# Patient Record
Sex: Female | Born: 1965 | Race: White | Hispanic: No | Marital: Single | Smoking: Current every day smoker
Health system: Southern US, Community
[De-identification: ages and names within clinical notes are randomized; demographics above are authoritative.]

## PROBLEM LIST (undated history)

## (undated) DIAGNOSIS — M5030 Other cervical disc degeneration, unspecified cervical region: Secondary | ICD-10-CM

## (undated) DIAGNOSIS — M5136 Other intervertebral disc degeneration, lumbar region: Secondary | ICD-10-CM

## (undated) DIAGNOSIS — D751 Secondary polycythemia: Secondary | ICD-10-CM

## (undated) DIAGNOSIS — I10 Essential (primary) hypertension: Secondary | ICD-10-CM

## (undated) DIAGNOSIS — M069 Rheumatoid arthritis, unspecified: Secondary | ICD-10-CM

## (undated) DIAGNOSIS — M8700 Idiopathic aseptic necrosis of unspecified bone: Secondary | ICD-10-CM

## (undated) DIAGNOSIS — F32A Depression, unspecified: Secondary | ICD-10-CM

## (undated) DIAGNOSIS — M4180 Other forms of scoliosis, site unspecified: Secondary | ICD-10-CM

## (undated) DIAGNOSIS — E119 Type 2 diabetes mellitus without complications: Secondary | ICD-10-CM

## (undated) DIAGNOSIS — F329 Major depressive disorder, single episode, unspecified: Secondary | ICD-10-CM

## (undated) DIAGNOSIS — E669 Obesity, unspecified: Secondary | ICD-10-CM

## (undated) DIAGNOSIS — M5134 Other intervertebral disc degeneration, thoracic region: Secondary | ICD-10-CM

## (undated) DIAGNOSIS — Z720 Tobacco use: Secondary | ICD-10-CM

## (undated) DIAGNOSIS — G629 Polyneuropathy, unspecified: Secondary | ICD-10-CM

## (undated) DIAGNOSIS — D6859 Other primary thrombophilia: Secondary | ICD-10-CM

## (undated) DIAGNOSIS — F419 Anxiety disorder, unspecified: Secondary | ICD-10-CM

## (undated) DIAGNOSIS — M479 Spondylosis, unspecified: Secondary | ICD-10-CM

## (undated) HISTORY — DX: Other forms of scoliosis, site unspecified: M41.80

## (undated) HISTORY — DX: Other intervertebral disc degeneration, thoracic region: M51.34

## (undated) HISTORY — DX: Major depressive disorder, single episode, unspecified: F32.9

## (undated) HISTORY — DX: Secondary polycythemia: D75.1

## (undated) HISTORY — DX: Rheumatoid arthritis, unspecified: M06.9

## (undated) HISTORY — PX: EYE SURGERY: SHX253

## (undated) HISTORY — DX: Idiopathic aseptic necrosis of unspecified bone: M87.00

## (undated) HISTORY — DX: Type 2 diabetes mellitus without complications: E11.9

## (undated) HISTORY — DX: Obesity, unspecified: E66.9

## (undated) HISTORY — DX: Depression, unspecified: F32.A

## (undated) HISTORY — DX: Spondylosis, unspecified: M47.9

## (undated) HISTORY — DX: Other primary thrombophilia: D68.59

## (undated) HISTORY — DX: Other intervertebral disc degeneration, lumbar region: M51.36

## (undated) HISTORY — DX: Other cervical disc degeneration, unspecified cervical region: M50.30

## (undated) HISTORY — DX: Anxiety disorder, unspecified: F41.9

## (undated) HISTORY — DX: Essential (primary) hypertension: I10

## (undated) HISTORY — DX: Polyneuropathy, unspecified: G62.9

## (undated) HISTORY — DX: Tobacco use: Z72.0

## (undated) HISTORY — PX: BILATERAL CARPAL TUNNEL RELEASE: SHX6508

## (undated) HISTORY — PX: THIRD VENTRICULOSTOMY: SHX2497

---

## 2014-03-06 ENCOUNTER — Other Ambulatory Visit (HOSPITAL_COMMUNITY): Payer: Self-pay

## 2014-03-06 ENCOUNTER — Other Ambulatory Visit (HOSPITAL_COMMUNITY): Payer: Self-pay | Admitting: Nurse Practitioner

## 2014-03-06 ENCOUNTER — Ambulatory Visit (HOSPITAL_COMMUNITY)
Admission: RE | Admit: 2014-03-06 | Discharge: 2014-03-06 | Disposition: A | Discharge status: Home / Self Care | Payer: Medicare Other | Source: Ambulatory Visit | Attending: Nurse Practitioner | Admitting: Nurse Practitioner

## 2014-03-06 DIAGNOSIS — M47812 Spondylosis without myelopathy or radiculopathy, cervical region: Secondary | ICD-10-CM | POA: Diagnosis not present

## 2014-03-06 DIAGNOSIS — S32009A Unspecified fracture of unspecified lumbar vertebra, initial encounter for closed fracture: Secondary | ICD-10-CM | POA: Diagnosis not present

## 2014-03-06 DIAGNOSIS — M549 Dorsalgia, unspecified: Secondary | ICD-10-CM | POA: Diagnosis present

## 2014-03-06 DIAGNOSIS — M47817 Spondylosis without myelopathy or radiculopathy, lumbosacral region: Secondary | ICD-10-CM | POA: Diagnosis not present

## 2014-03-06 DIAGNOSIS — M5137 Other intervertebral disc degeneration, lumbosacral region: Secondary | ICD-10-CM | POA: Diagnosis not present

## 2014-03-06 DIAGNOSIS — M542 Cervicalgia: Secondary | ICD-10-CM | POA: Diagnosis not present

## 2014-03-06 DIAGNOSIS — F17200 Nicotine dependence, unspecified, uncomplicated: Secondary | ICD-10-CM | POA: Insufficient documentation

## 2014-03-06 DIAGNOSIS — IMO0002 Reserved for concepts with insufficient information to code with codable children: Secondary | ICD-10-CM

## 2014-03-06 DIAGNOSIS — R918 Other nonspecific abnormal finding of lung field: Secondary | ICD-10-CM | POA: Insufficient documentation

## 2014-03-06 DIAGNOSIS — M5489 Other dorsalgia: Secondary | ICD-10-CM

## 2014-03-06 DIAGNOSIS — M51379 Other intervertebral disc degeneration, lumbosacral region without mention of lumbar back pain or lower extremity pain: Secondary | ICD-10-CM | POA: Insufficient documentation

## 2014-03-14 ENCOUNTER — Other Ambulatory Visit (HOSPITAL_COMMUNITY): Payer: Self-pay

## 2014-03-14 DIAGNOSIS — J984 Other disorders of lung: Secondary | ICD-10-CM

## 2014-03-21 ENCOUNTER — Ambulatory Visit (HOSPITAL_COMMUNITY): Payer: Medicare Other | Attending: Nurse Practitioner

## 2014-09-16 ENCOUNTER — Ambulatory Visit (HOSPITAL_COMMUNITY): Payer: Medicare Other | Admitting: Hematology & Oncology

## 2014-10-03 ENCOUNTER — Encounter (HOSPITAL_COMMUNITY): Payer: Medicare Other | Attending: Hematology & Oncology | Admitting: Hematology & Oncology

## 2014-10-03 ENCOUNTER — Encounter (HOSPITAL_COMMUNITY): Payer: Self-pay | Admitting: Hematology & Oncology

## 2014-10-03 VITALS — BP 147/65 | HR 78 | Temp 98.2°F | Resp 20 | Ht 64.0 in | Wt 178.9 lb

## 2014-10-03 DIAGNOSIS — D6859 Other primary thrombophilia: Secondary | ICD-10-CM

## 2014-10-03 DIAGNOSIS — D751 Secondary polycythemia: Secondary | ICD-10-CM

## 2014-10-03 DIAGNOSIS — Z86711 Personal history of pulmonary embolism: Secondary | ICD-10-CM | POA: Diagnosis not present

## 2014-10-03 DIAGNOSIS — Z720 Tobacco use: Secondary | ICD-10-CM

## 2014-10-03 DIAGNOSIS — Z86718 Personal history of other venous thrombosis and embolism: Secondary | ICD-10-CM

## 2014-10-03 DIAGNOSIS — M069 Rheumatoid arthritis, unspecified: Secondary | ICD-10-CM | POA: Diagnosis not present

## 2014-10-03 DIAGNOSIS — Z7901 Long term (current) use of anticoagulants: Secondary | ICD-10-CM | POA: Diagnosis not present

## 2014-10-03 DIAGNOSIS — Z139 Encounter for screening, unspecified: Secondary | ICD-10-CM | POA: Insufficient documentation

## 2014-10-03 LAB — COMPREHENSIVE METABOLIC PANEL
ALBUMIN: 4 g/dL (ref 3.5–5.2)
ALT: 12 U/L (ref 0–35)
ANION GAP: 9 (ref 5–15)
AST: 15 U/L (ref 0–37)
Alkaline Phosphatase: 101 U/L (ref 39–117)
BUN: 10 mg/dL (ref 6–23)
CO2: 25 mmol/L (ref 19–32)
Calcium: 9.3 mg/dL (ref 8.4–10.5)
Chloride: 103 mmol/L (ref 96–112)
Creatinine, Ser: 0.49 mg/dL — ABNORMAL LOW (ref 0.50–1.10)
GFR calc non Af Amer: >90 mL/min (ref >90)
Glucose, Bld: 108 mg/dL — ABNORMAL HIGH (ref 70–99)
POTASSIUM: 3.5 mmol/L (ref 3.5–5.1)
Sodium: 137 mmol/L (ref 135–145)
TOTAL PROTEIN: 7.7 g/dL (ref 6.0–8.3)
Total Bilirubin: 0.4 mg/dL (ref 0.3–1.2)

## 2014-10-03 LAB — CBC WITH DIFFERENTIAL/PLATELET
Basophils Absolute: 0 10*3/uL (ref 0.0–0.1)
Basophils Relative: 0 % (ref 0–1)
Eosinophils Absolute: 0.3 10*3/uL (ref 0.0–0.7)
Eosinophils Relative: 2 % (ref 0–5)
HEMATOCRIT: 46.3 % — AB (ref 36.0–46.0)
HEMOGLOBIN: 16.3 g/dL — AB (ref 12.0–15.0)
Lymphocytes Relative: 35 % (ref 12–46)
Lymphs Abs: 4.4 10*3/uL — ABNORMAL HIGH (ref 0.7–4.0)
MCH: 34.9 pg — AB (ref 26.0–34.0)
MCHC: 35.2 g/dL (ref 30.0–36.0)
MCV: 99.1 fL (ref 78.0–100.0)
Monocytes Absolute: 0.6 10*3/uL (ref 0.1–1.0)
Monocytes Relative: 5 % (ref 3–12)
Neutro Abs: 7.3 10*3/uL (ref 1.7–7.7)
Neutrophils Relative %: 58 % (ref 43–77)
Platelets: 220 10*3/uL (ref 150–400)
RBC: 4.67 MIL/uL (ref 3.87–5.11)
RDW: 14.5 % (ref 11.5–15.5)
WBC: 12.6 10*3/uL — ABNORMAL HIGH (ref 4.0–10.5)

## 2014-10-03 MED ORDER — ENOXAPARIN SODIUM 80 MG/0.8ML ~~LOC~~ SOLN: 80.0000 mg | Freq: Two times a day (BID) | SUBCUTANEOUS | Status: DC

## 2014-10-03 NOTE — Progress Notes (Signed)
Thurnell Lose presented for [employer]. Labs per MD order drawn via Peripheral Line 23 gauge needle inserted in left AC  Good blood return present. Procedure without incident.  Needle removed intact. Patient tolerated procedure well.

## 2014-10-03 NOTE — Patient Instructions (Signed)
[facility] at [facility] Discharge Instructions  RECOMMENDATIONS MADE BY THE CONSULTANT AND ANY TEST RESULTS WILL BE SENT TO YOUR REFERRING PHYSICIAN.  Exam and discussion by Dr. Galen Manila Will check labs today Get a release from you to get records from your previous MD Will get mammogram and CT of chest scheduled as well. Prescription for your lovenox was escribed to your pharmacy.  Call us with any questions or concerns.  Follow-up in 6 months with labs and office visit.   Thank you for choosing [facility] at [facility] to provide your oncology and hematology care.  To afford each patient quality time with our provider, please arrive at least 15 minutes before your scheduled appointment time.    You need to re-schedule your appointment should you arrive 10 or more minutes late.  We strive to give you quality time with our providers, and arriving late affects you and other patients whose appointments are after yours.  Also, if you no show three or more times for appointments you may be dismissed from the clinic at the providers discretion.     Again, thank you for choosing [facility].  Our hope is that these requests will decrease the amount of time that you wait before being seen by our physicians.       _____________________________________________________________  Should you have questions after your visit to [facility], please contact our office at [phone] between the hours of 8:30 a.m. and 4:30 p.m.  Voicemails left after 4:30 p.m. will not be returned until the following business day.  For prescription refill requests, have your pharmacy contact our office.

## 2014-10-03 NOTE — Progress Notes (Signed)
[facility] CONSULT NOTE  Patient Care Team: Augustine Radar, MD as PCP - General (Nurse Practitioner)  CHIEF COMPLAINTS/PURPOSE OF CONSULTATION:  Protein S deficiency Rheumatoid Arthritis   HISTORY OF PRESENTING ILLNESS:  Faith Porter 49 y.o. female is here because of a diagnosis of Protein S deficiency.  She is maintained on lovenox daily. She also notes a history of RA. She had previously received all of her hematologic care at [facility] in [place] but since moving to [place] no longer desires to travel there.She has locally established with Augustine Radar as her primary care provider.   She denies any difficulty with her lovenox.  She has been on 80 mg sq bid for many years.  She recalls having phlebotomies in the past for "polycythemia" but she does not know if she has P.Vera or if her high red count is from smoking.   She reports her diagnosis came after she could not breath and was diagnosed with blood clots in both lungs and both LE. She states she had additional blood clots since her original diagnosis and that is why she is now on lovenox.  She states she clotted through warfarin therapy and was told she could not take coumadin.  In regards to her RA, she has been on prednisone, MTX, humara and Enbrel in the past.   She has never had a screening mammogram or colonoscopy.  MEDICAL HISTORY:  Past Medical History  Diagnosis Date  . Anxiety disorder   . Degenerative disc disease, cervical   . Degenerative disc disease, lumbar   . Degenerative disc disease, thoracic   . Depression   . Dextroscoliosis   . Diabetes   . Hypertension   . Obesity   . Protein S deficiency   . Rheumatoid arthritis   . Spondylosis   . Tobacco abuse   . AVN (avascular necrosis of bone)     right knee  . Peripheral neuropathy     SURGICAL HISTORY: Past Surgical History  Procedure Laterality Date  . Bilateral carpal tunnel release    . Eye surgery Right    repair fx right orbit  . Third ventriculostomy      SOCIAL HISTORY: History   Social History  . Marital Status: Single    Spouse Name: N/A  . Number of Children: N/A  . Years of Education: N/A   Occupational History  . Not on file.   Social History Main Topics  . Smoking status: Current Every Day Smoker -- 1.00 packs/day for 38 years    Types: Cigarettes, Cigars, Pipe  . Smokeless tobacco: Never Used     Comment: not interested in classes would like to use patches  . Alcohol Use: No  . Drug Use: No  . Sexual Activity: Yes    Birth Control/ Protection: None   Other Topics Concern  . Not on file   Social History Narrative  . No narrative on file  Smokes over one ppd and has done so for "many years"  (38 years) She also uses cigars and a pipe. She denies any ETOH use. She is single and has no children.  She previously lived in [place], [place]. She is disabled but previously worked in [employer].  She has a good friend in [place] whom she resides with.  FAMILY HISTORY: Father is alive at 55 with diabetes and HTN Mother died in her 43's she was on dialysis and had a defibrillator She has one brother with protein S deficiency  ALLERGIES:  is allergic to latex and penicillins.  MEDICATIONS:  Current Outpatient Prescriptions  Medication Sig Dispense Refill  . cyclobenzaprine (FLEXERIL) 10 MG tablet Take 10 mg by mouth 3 (three) times daily as needed for muscle spasms.    . DULoxetine (CYMBALTA) 60 MG capsule Take 60 mg by mouth daily.    Marland Kitchen enoxaparin (LOVENOX) 80 MG/0.8ML injection Inject 0.8 mLs (80 mg total) into the skin every 12 (twelve) hours. 60 Syringe 5  . HYDROcodone-acetaminophen (NORCO) 10-325 MG per tablet Take 1 tablet by mouth every 4 (four) hours as needed.    Marland Kitchen ibuprofen (ADVIL,MOTRIN) 200 MG tablet Take 200 mg by mouth every 6 (six) hours as needed.    . metoprolol (LOPRESSOR) 50 MG tablet Take 50 mg by mouth at bedtime.    . pregabalin (LYRICA) 75 MG capsule Take  75 mg by mouth 3 (three) times daily.     No current facility-administered medications for this visit.    Review of Systems  Constitutional: Negative.   HENT: Negative.   Eyes: Negative.   Respiratory: Positive for cough and shortness of breath.   Cardiovascular: Negative.   Gastrointestinal: Negative.   Genitourinary: Negative.   Musculoskeletal: Positive for back pain and joint pain.  Skin: Negative.   Neurological: Negative.   Endo/Heme/Allergies: Negative.   Psychiatric/Behavioral: Negative.     PHYSICAL EXAMINATION: ECOG PERFORMANCE STATUS: 0 - Asymptomatic  Filed Vitals:   10/03/14 1529  BP: 147/65  Pulse: 78  Temp: 98.2 F (36.8 C)  Resp: 20   Filed Weights   10/03/14 1529  Weight: 178 lb 14.4 oz (81.149 kg)     Physical Exam  Constitutional: She is oriented to person, place, and time and well-developed, well-nourished, and in no distress.  HENT:  Head: Normocephalic and atraumatic.  Nose: Nose normal.  Mouth/Throat: Oropharynx is clear and moist. No oropharyngeal exudate.  Eyes: Conjunctivae and EOM are normal. Pupils are equal, round, and reactive to light. Right eye exhibits no discharge. Left eye exhibits no discharge. No scleral icterus.  Neck: Normal range of motion. Neck supple. No tracheal deviation present. No thyromegaly present.  Cardiovascular: Normal rate, regular rhythm and normal heart sounds.  Exam reveals no gallop and no friction rub.   No murmur heard. Pulmonary/Chest: Effort normal. She has no wheezes. She has no rales.  Occasional coarse rhonchi heard throughout  Abdominal: Soft. Bowel sounds are normal. She exhibits no distension and no mass. There is no tenderness. There is no rebound and no guarding.  Musculoskeletal: Normal range of motion. She exhibits no edema.  Lymphadenopathy:    She has no cervical adenopathy.  Neurological: She is alert and oriented to person, place, and time. She has normal reflexes. No cranial nerve deficit.  Gait normal. Coordination normal.  Skin: Skin is warm and dry. No rash noted.  Psychiatric: Mood, memory, affect and judgment normal.  Nursing note and vitals reviewed.    LABORATORY DATA:  I have reviewed the data as listed Lab Results  Component Value Date   WBC 12.6* 10/03/2014   HGB 16.3* 10/03/2014   HCT 46.3* 10/03/2014   MCV 99.1 10/03/2014   PLT 220 10/03/2014     Chemistry      Component Value Date/Time   NA 137 10/03/2014 1620   K 3.5 10/03/2014 1620   CL 103 10/03/2014 1620   CO2 25 10/03/2014 1620   BUN 10 10/03/2014 1620   CREATININE 0.49* 10/03/2014 1620      Component  Value Date/Time   CALCIUM 9.3 10/03/2014 1620   ALKPHOS 101 10/03/2014 1620   AST 15 10/03/2014 1620   ALT 12 10/03/2014 1620   BILITOT 0.4 10/03/2014 1620       ASSESSMENT & PLAN:  Protein S deficiency 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 Secondary to above  Protein S deficiency  We will continue her on Lovenox as prescribed. We will work on obtaining her records from her prior hematologist. I have advised her if she has any difficulties with her Lovenox prescription prior to follow-up to let us know.   Polycythemia We will obtain the records from her prior hematologist in regards to her polycythemia. I have also recommended obtaining baseline laboratory studies today. If she has a diagnosis of polycythemia vera and not secondary polycythemia I advised her I may need to bring her back sooner than her scheduled 6 month follow-up.   Tobacco Abuse  I addressed the importance of smoking cessation with the patient in detail.  We discussed the health benefits of cessation.  We discussed the health detriments of ongoing tobacco use including but not limited to COPD, heart disease and malignancy. We reviewed the multiple options for cessation and I offered to refer her to smoking cessation classes. We  discussed other alternatives to quit such as chantix, wellbutrin. We will continue to address this moving forward. She would like to be referred for lung cancer screening. We can make the referral and see if she qualifies.   Screening  She agrees to screening mammogram and we will arrange this for her. I have ordered a low-dose CT of the chest for lung cancer screening. I advised her do not think she meets all of the criteria for screening. We will keep her apprised as to the approval of her lung cancer screening tests. She is currently not interested in pursuing a screening colonoscopy. We will certainly address this at her 6 month follow-up.   Orders Placed This Encounter  Procedures  . CT CHEST LUNG CA SCREEN LOW DOSE W/O CM    Standing Status: Future     Number of Occurrences:      Standing Expiration Date: 12/03/2015    Order Specific Question:  Reason for Exam (SYMPTOM  OR DIAGNOSIS REQUIRED)    Answer:  1-2 ppd X 38 years, pipe, cigars, screening lung ca    Order Specific Question:  Is the patient pregnant?    Answer:  No    Order Specific Question:  Preferred Imaging Location?    Answer:  [facility]  . MM Digital Screening    Standing Status: Future     Number of Occurrences:      Standing Expiration Date: 10/03/2015    Order Specific Question:  Reason for Exam (SYMPTOM  OR DIAGNOSIS REQUIRED)    Answer:  screening    Order Specific Question:  Is the patient pregnant?    Answer:  No    Order Specific Question:  Preferred imaging location?    Answer:  [facility]  . CBC with Differential    Standing Status: Future     Number of Occurrences: 1     Standing Expiration Date: 10/03/2015  . Comprehensive metabolic panel    Standing Status: Future     Number of Occurrences: 1     Standing Expiration Date: 10/03/2015  . CBC with Differential    Standing Status: Future  Number of Occurrences:      Standing Expiration Date: 10/03/2015  . Comprehensive metabolic  panel    Standing Status: Future     Number of Occurrences:      Standing Expiration Date: 10/03/2015    All questions were answered. The patient knows to call the clinic with any problems, questions or concerns. This note was electronically signed.    Arvil Chaco, MD  11/10/2014 4:28 PM

## 2014-10-14 ENCOUNTER — Ambulatory Visit (HOSPITAL_COMMUNITY): Payer: Medicare Other

## 2014-10-14 ENCOUNTER — Ambulatory Visit (HOSPITAL_COMMUNITY): Admission: RE | Admit: 2014-10-14 | Payer: Medicare Other | Source: Ambulatory Visit

## 2014-10-29 ENCOUNTER — Encounter (HOSPITAL_COMMUNITY): Payer: Self-pay

## 2014-11-21 ENCOUNTER — Ambulatory Visit (HOSPITAL_COMMUNITY): Payer: Medicare Other | Admitting: Hematology & Oncology

## 2014-11-21 NOTE — Progress Notes (Signed)
This encounter was created in error - please disregard.

## 2015-04-04 ENCOUNTER — Ambulatory Visit (HOSPITAL_COMMUNITY): Payer: Medicare Other | Admitting: Hematology & Oncology

## 2015-04-04 ENCOUNTER — Other Ambulatory Visit (HOSPITAL_COMMUNITY): Payer: Medicare Other

## 2015-04-13 NOTE — Progress Notes (Signed)
This encounter was created in error - please disregard.

## 2015-05-05 ENCOUNTER — Ambulatory Visit (HOSPITAL_COMMUNITY)
Admission: RE | Admit: 2015-05-05 | Discharge: 2015-05-05 | Disposition: A | Discharge status: Home / Self Care | Payer: Medicare Other | Source: Ambulatory Visit | Attending: Hematology & Oncology | Admitting: Hematology & Oncology

## 2015-05-05 ENCOUNTER — Encounter (HOSPITAL_COMMUNITY): Payer: Self-pay | Admitting: Hematology & Oncology

## 2015-05-05 ENCOUNTER — Encounter (HOSPITAL_COMMUNITY): Payer: Medicare Other

## 2015-05-05 ENCOUNTER — Encounter (HOSPITAL_COMMUNITY): Payer: Medicare Other | Attending: Hematology & Oncology | Admitting: Hematology & Oncology

## 2015-05-05 VITALS — BP 137/98 | HR 87 | Temp 98.7°F | Resp 18 | Wt 191.2 lb

## 2015-05-05 DIAGNOSIS — Z86718 Personal history of other venous thrombosis and embolism: Secondary | ICD-10-CM | POA: Diagnosis not present

## 2015-05-05 DIAGNOSIS — Z7901 Long term (current) use of anticoagulants: Secondary | ICD-10-CM

## 2015-05-05 DIAGNOSIS — J441 Chronic obstructive pulmonary disease with (acute) exacerbation: Secondary | ICD-10-CM

## 2015-05-05 DIAGNOSIS — Z720 Tobacco use: Secondary | ICD-10-CM | POA: Insufficient documentation

## 2015-05-05 DIAGNOSIS — D6859 Other primary thrombophilia: Secondary | ICD-10-CM | POA: Diagnosis present

## 2015-05-05 DIAGNOSIS — M8448XA Pathological fracture, other site, initial encounter for fracture: Secondary | ICD-10-CM | POA: Insufficient documentation

## 2015-05-05 DIAGNOSIS — R0789 Other chest pain: Secondary | ICD-10-CM | POA: Diagnosis not present

## 2015-05-05 DIAGNOSIS — Z86711 Personal history of pulmonary embolism: Secondary | ICD-10-CM | POA: Diagnosis not present

## 2015-05-05 DIAGNOSIS — J40 Bronchitis, not specified as acute or chronic: Secondary | ICD-10-CM

## 2015-05-05 DIAGNOSIS — M7989 Other specified soft tissue disorders: Secondary | ICD-10-CM | POA: Insufficient documentation

## 2015-05-05 DIAGNOSIS — M79605 Pain in left leg: Secondary | ICD-10-CM | POA: Diagnosis present

## 2015-05-05 DIAGNOSIS — Z0000 Encounter for general adult medical examination without abnormal findings: Secondary | ICD-10-CM

## 2015-05-05 DIAGNOSIS — R0602 Shortness of breath: Secondary | ICD-10-CM | POA: Diagnosis not present

## 2015-05-05 DIAGNOSIS — F17200 Nicotine dependence, unspecified, uncomplicated: Secondary | ICD-10-CM | POA: Insufficient documentation

## 2015-05-05 DIAGNOSIS — R05 Cough: Secondary | ICD-10-CM | POA: Insufficient documentation

## 2015-05-05 DIAGNOSIS — D751 Secondary polycythemia: Secondary | ICD-10-CM | POA: Diagnosis not present

## 2015-05-05 DIAGNOSIS — M069 Rheumatoid arthritis, unspecified: Secondary | ICD-10-CM | POA: Diagnosis not present

## 2015-05-05 LAB — COMPREHENSIVE METABOLIC PANEL
ALT: 12 U/L — ABNORMAL LOW (ref 14–54)
AST: 17 U/L (ref 15–41)
Albumin: 3.7 g/dL (ref 3.5–5.0)
Alkaline Phosphatase: 118 U/L (ref 38–126)
Anion gap: 7 (ref 5–15)
BUN: 8 mg/dL (ref 6–20)
CHLORIDE: 106 mmol/L (ref 101–111)
CO2: 26 mmol/L (ref 22–32)
Calcium: 8.9 mg/dL (ref 8.9–10.3)
Creatinine, Ser: 0.5 mg/dL (ref 0.44–1.00)
GFR calc Af Amer: >60 mL/min (ref >60)
GFR calc non Af Amer: >60 mL/min (ref >60)
Glucose, Bld: 143 mg/dL — ABNORMAL HIGH (ref 65–99)
POTASSIUM: 3.5 mmol/L (ref 3.5–5.1)
Sodium: 139 mmol/L (ref 135–145)
Total Bilirubin: 0.4 mg/dL (ref 0.3–1.2)
Total Protein: 7.9 g/dL (ref 6.5–8.1)

## 2015-05-05 LAB — CBC WITH DIFFERENTIAL/PLATELET
BASOS PCT: 0 %
Basophils Absolute: 0.1 10*3/uL (ref 0.0–0.1)
Eosinophils Absolute: 0.3 10*3/uL (ref 0.0–0.7)
Eosinophils Relative: 2 %
HCT: 50.4 % — ABNORMAL HIGH (ref 36.0–46.0)
Hemoglobin: 17.9 g/dL — ABNORMAL HIGH (ref 12.0–15.0)
Lymphocytes Relative: 32 %
Lymphs Abs: 4.8 10*3/uL — ABNORMAL HIGH (ref 0.7–4.0)
MCH: 35.4 pg — ABNORMAL HIGH (ref 26.0–34.0)
MCHC: 35.5 g/dL (ref 30.0–36.0)
MCV: 99.8 fL (ref 78.0–100.0)
MONO ABS: 0.8 10*3/uL (ref 0.1–1.0)
Monocytes Relative: 5 %
NEUTROS ABS: 8.9 10*3/uL — AB (ref 1.7–7.7)
Neutrophils Relative %: 61 %
PLATELETS: 289 10*3/uL (ref 150–400)
RBC: 5.05 MIL/uL (ref 3.87–5.11)
RDW: 15 % (ref 11.5–15.5)
WBC: 14.9 10*3/uL — AB (ref 4.0–10.5)

## 2015-05-05 NOTE — Patient Instructions (Addendum)
..  [facility] at [facility] Discharge Instructions  RECOMMENDATIONS MADE BY THE CONSULTANT AND ANY TEST RESULTS WILL BE SENT TO YOUR REFERRING PHYSICIAN.  Chest xray and ultrasound today. Please go by radiology on your way out We have scheduled you for a mammogram  Return in 4 months  Thank you for choosing [facility] at [facility] to provide your oncology and hematology care.  To afford each patient quality time with our provider, please arrive at least 15 minutes before your scheduled appointment time.    You need to re-schedule your appointment should you arrive 10 or more minutes late.  We strive to give you quality time with our providers, and arriving late affects you and other patients whose appointments are after yours.  Also, if you no show three or more times for appointments you may be dismissed from the clinic at the providers discretion.     Again, thank you for choosing [facility].  Our hope is that these requests will decrease the amount of time that you wait before being seen by our physicians.       _____________________________________________________________  Should you have questions after your visit to [facility], please contact our office at [phone] between the hours of 8:30 a.m. and 4:30 p.m.  Voicemails left after 4:30 p.m. will not be returned until the following business day.  For prescription refill requests, have your pharmacy contact our office.

## 2015-05-05 NOTE — Progress Notes (Signed)
Faith Porter  Patient Care Team: Faith Radar, MD as PCP - General (Nurse Practitioner)  CHIEF COMPLAINTS: Protein S deficiency Rheumatoid Arthritis   HISTORY OF PRESENTING ILLNESS:  Faith Porter 49 y.o. female is here because of a diagnosis of Protein S deficiency.  She is maintained on lovenox daily. She also notes a history of RA. She had previously received all of her hematologic care at [facility] in [place] but since moving to [place] no longer desires to travel there.She has locally established with Faith Porter as her primary care provider.   She denies any difficulty with her lovenox.  She has been on 80 mg sq bid for many years.  She recalls having phlebotomies in the past for "polycythemia" but she does not know if she has P.Vera or if her high red count is from smoking.   She reports her diagnosis came after she could not breath and was diagnosed with blood clots in both lungs and both LE. She states she had additional blood clots since her original diagnosis and that is why she is now on lovenox.  She states she clotted through warfarin therapy and was told she could not take coumadin.  In regards to her RA, she has been on prednisone, MTX, humara and Enbrel in the past.   She has never had a screening mammogram or colonoscopy.   Faith Porter is here alone today and here for a lovenox refill.  She reports increased trouble walking due to her knees.  The patient states that she has been smoking less recently because she has had the flu. This flu has lasted for about 3 weeks, with a fever the first 3 days. She reports that the cough still remains.  She forgot to go to her scheduled mammogram. She also no-showed for two scheduled appointments for further evaluation and work-up of her polycythemia and intermittent leukocytosis.  She is complaining of worsening LLE pain and swelling. She may have missed several doses of her lovenox  and is worried about recurrent DVT.  MEDICAL HISTORY:  Past Medical History  Diagnosis Date  . Anxiety disorder   . Degenerative disc disease, cervical   . Degenerative disc disease, lumbar   . Degenerative disc disease, thoracic   . Depression   . Dextroscoliosis   . Diabetes (HCC)   . Hypertension   . Obesity   . Protein S deficiency (HCC)   . Rheumatoid arthritis (HCC)   . Spondylosis   . Tobacco abuse   . AVN (avascular necrosis of bone) (HCC)     right knee  . Peripheral neuropathy (HCC)     SURGICAL HISTORY: Past Surgical History  Procedure Laterality Date  . Bilateral carpal tunnel release    . Eye surgery Right     repair fx right orbit  . Third ventriculostomy      SOCIAL HISTORY: Social History   Social History  . Marital Status: Single    Spouse Name: N/A  . Number of Children: N/A  . Years of Education: N/A   Occupational History  . Not on file.   Social History Main Topics  . Smoking status: Current Every Day Smoker -- 1.00 packs/day for 38 years    Types: Cigarettes, Cigars, Pipe  . Smokeless tobacco: Never Used     Comment: not interested in classes would like to use patches  . Alcohol Use: No  . Drug Use: No  . Sexual Activity: Yes  Birth Control/ Protection: None   Other Topics Concern  . Not on file   Social History Narrative  Smokes over one ppd and has done so for "many years"  (38 years) She also uses cigars and a pipe. She denies any ETOH use. She is single and has no children.  She previously lived in [place], [place]. She is disabled but previously worked in [employer].  She has a good friend in [place] whom she resides with.  FAMILY HISTORY: Father is alive at 40 with diabetes and HTN Mother died in her 32's she was on dialysis and had a defibrillator She has one brother with protein S deficiency  ALLERGIES:  is allergic to latex and penicillins.  MEDICATIONS:  Current Outpatient Prescriptions  Medication Sig Dispense Refill  .  cloNIDine (CATAPRES) 0.1 MG tablet Take 0.1 mg by mouth 2 (two) times daily.    . cyclobenzaprine (FLEXERIL) 10 MG tablet Take 10 mg by mouth 4 (four) times daily.     . DULoxetine (CYMBALTA) 60 MG capsule Take 60 mg by mouth 2 (two) times daily.     Marland Kitchen enoxaparin (LOVENOX) 80 MG/0.8ML injection Inject 0.8 mLs (80 mg total) into the skin every 12 (twelve) hours. 60 Syringe 5  . ibuprofen (ADVIL,MOTRIN) 200 MG tablet Take 200 mg by mouth every 6 (six) hours as needed.    Marland Kitchen oxycodone (OXY-IR) 5 MG capsule Take 10 mg by mouth every 4 (four) hours as needed.    . pregabalin (LYRICA) 75 MG capsule Take 200 mg by mouth 3 (three) times daily.     Marland Kitchen azithromycin (ZITHROMAX Z-PAK) 250 MG tablet Take 2 tablets on day 1 by mouth then one tablet daily thereafter until gone 6 each 0  . fluconazole (DIFLUCAN) 150 MG tablet Take one tablet by mouth and repeat with second tablet 3 days later 2 tablet 0  . HYDROcodone-acetaminophen (NORCO) 10-325 MG per tablet Take 1 tablet by mouth every 4 (four) hours as needed.    . methylPREDNISolone (MEDROL DOSEPAK) 4 MG TBPK tablet Use as directed 21 tablet 0  . metoprolol (LOPRESSOR) 50 MG tablet Take 50 mg by mouth at bedtime.     No current facility-administered medications for this visit.    Review of Systems  Constitutional: Negative.        Ambulates with cane.  HENT: Negative.   Eyes: Negative.   Respiratory: Positive for cough.   Cardiovascular: Negative.   Gastrointestinal: Negative.   Genitourinary: Negative.   Musculoskeletal: Positive for back pain and joint pain.  Skin: Negative.   Neurological: Negative.   Endo/Heme/Allergies: Negative.   Psychiatric/Behavioral: Negative.     PHYSICAL EXAMINATION: ECOG PERFORMANCE STATUS: 0 - Asymptomatic  Filed Vitals:   05/05/15 1350  BP: 137/98  Pulse: 87  Temp: 98.7 F (37.1 C)  Resp: 18   Filed Weights   05/05/15 1350  Weight: 191 lb 3.2 oz (86.728 kg)     Physical Exam  Constitutional: She is  oriented to person, place, and time and well-developed, well-nourished, and in no distress.  HENT:  Head: Normocephalic and atraumatic.  Nose: Nose normal.  Mouth/Throat: Oropharynx is clear and moist. No oropharyngeal exudate.  Eyes: Conjunctivae and EOM are normal. Pupils are equal, round, and reactive to light. Right eye exhibits no discharge. Left eye exhibits no discharge. No scleral icterus.  Neck: Normal range of motion. Neck supple. No tracheal deviation present. No thyromegaly present.  Cardiovascular: Normal rate, regular rhythm and normal heart sounds.  Exam reveals no gallop and no friction rub.   No murmur heard. Pulmonary/Chest: Effort normal. She has wheezes. She has no rales.  Decreased breath sounds throughout. Expiratory wheezing. Occasional rhonchi heard throughout.  Abdominal: Soft. Bowel sounds are normal. She exhibits no distension and no mass. There is no tenderness. There is no rebound and no guarding.  Musculoskeletal: Normal range of motion. She exhibits no edema.  LLE > RLE. No erythema or warmth  Lymphadenopathy:    She has no cervical adenopathy.  Neurological: She is alert and oriented to person, place, and time. She has normal reflexes. No cranial nerve deficit. Gait normal. Coordination normal.  Skin: Skin is warm and dry. No rash noted.  Psychiatric: Mood, memory, affect and judgment normal.  Nursing Porter and vitals reviewed.    LABORATORY DATA:  I have reviewed the data as listed Lab Results  Component Value Date   WBC 14.9* 05/05/2015   HGB 17.9* 05/05/2015   HCT 50.4* 05/05/2015   MCV 99.8 05/05/2015   PLT 289 05/05/2015     Chemistry      Component Value Date/Time   NA 139 05/05/2015 1331   K 3.5 05/05/2015 1331   CL 106 05/05/2015 1331   CO2 26 05/05/2015 1331   BUN 8 05/05/2015 1331   CREATININE 0.50 05/05/2015 1331      Component Value Date/Time   CALCIUM 8.9 05/05/2015 1331   ALKPHOS 118 05/05/2015 1331   AST 17 05/05/2015 1331    ALT 12* 05/05/2015 1331   BILITOT 0.4 05/05/2015 1331       ASSESSMENT & PLAN:  Protein S deficiency 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 Secondary to above  Protein S deficiency  We will continue her on Lovenox as prescribed. I have advised her if she has any difficulties with her Lovenox prescription prior to follow-up to let us know. We will check a LLE U/S today. She is encouraged to be strictly compliant with her lovenox. I have refilled her lovenox to [facility] in [place].  Polycythemia She missed several appointments for further follow-up of her polycythemia. Most likely it is secondary to her chronic tobacco use. However, given intermittent leukocytosis I would like further evaluation. She does not wish to have additional labs drawn today and we will plan on an MPD evaluation at her next f/u.    Tobacco Abuse  I addressed the importance of smoking cessation with the patient in detail.  We discussed the health benefits of cessation.  We discussed the health detriments of ongoing tobacco use including but not limited to COPD, heart disease and malignancy. We reviewed the multiple options for cessation and I offered to refer her to smoking cessation classes. We discussed other alternatives to quit such as chantix, wellbutrin. We will continue to address this moving forward.   Screening  She missed her screening mammogram. She does not want this re-ordered. We can address this again at f/u.  Bronchitis/COPD exacerbation  I have ordered a Chest X ray for today. I will prescribe an antibiotic for Ms. Llewellyn pending the X ray results.  Orders Placed This Encounter  Procedures  . DG Chest 2 View    Standing Status: Future     Number of Occurrences: 1     Standing Expiration Date: 05/04/2016    Order Specific Question:  Reason for Exam (SYMPTOM  OR DIAGNOSIS REQUIRED)    Answer:  screening  Order Specific Question:  Is the patient pregnant?    Answer:  No    Order Specific Question:  Preferred imaging location?    Answer:  [facility]  . US Venous Img Lower Unilateral Left    Standing Status: Future     Number of Occurrences: 1     Standing Expiration Date: 05/04/2016    Order Specific Question:  Reason for Exam (SYMPTOM  OR DIAGNOSIS REQUIRED)    Answer:  protein S deficiency, LLE pain swelling    Order Specific Question:  Preferred imaging location?    Answer:  [facility]    All questions were answered. The patient knows to call the clinic with any problems, questions or concerns. This Porter was electronically signed.    This document serves as a record of services personally performed by Loma Messing, MD. It was created on her behalf by Delana Meyer, a trained medical scribe. The creation of this record is based on the scribe's personal observations and the provider's statements to them. This document has been checked and approved by the attending provider.  I have reviewed the above documentation for accuracy and completeness, and I agree with the above.  Novella Olive. Penland, MD  06/06/2015 7:50 PM

## 2015-05-06 ENCOUNTER — Telehealth (HOSPITAL_COMMUNITY): Payer: Self-pay | Admitting: Emergency Medicine

## 2015-05-06 ENCOUNTER — Other Ambulatory Visit (HOSPITAL_COMMUNITY): Payer: Self-pay | Admitting: Hematology & Oncology

## 2015-05-06 MED ORDER — FLUCONAZOLE 150 MG PO TABS: ORAL_TABLET | ORAL | Status: AC

## 2015-05-06 MED ORDER — METHYLPREDNISOLONE 4 MG PO TBPK: ORAL_TABLET | ORAL | Status: AC

## 2015-05-06 MED ORDER — AZITHROMYCIN 250 MG PO TABS: ORAL_TABLET | ORAL | Status: AC

## 2015-05-06 MED ORDER — ENOXAPARIN SODIUM 80 MG/0.8ML ~~LOC~~ SOLN: 80.0000 mg | Freq: Two times a day (BID) | SUBCUTANEOUS | Status: DC

## 2015-05-06 NOTE — Telephone Encounter (Signed)
Pt notified of x-ray results.  Wanted to know about lovenox and something for yeast infection. Spoke with Dr Galen Manila and she will call these prescriptions in

## 2015-05-06 NOTE — Telephone Encounter (Signed)
-----   Message from Allene Pyo, MD sent at 05/06/2015  8:18 AM EDT ----- Advise patient i have called in zithromax/medrol dose pak. CXR shows no evidence pneumonia. No evidence of clot in leg. Dr.P

## 2015-05-06 NOTE — Telephone Encounter (Signed)
-----   Message from Shannon K Penland, MD sent at 05/06/2015  8:18 AM EDT ----- Advise patient i have called in zithromax/medrol dose pak. CXR shows no evidence pneumonia. No evidence of clot in leg. Dr.P 

## 2015-05-16 ENCOUNTER — Ambulatory Visit (HOSPITAL_COMMUNITY): Payer: Medicare Other

## 2015-09-04 ENCOUNTER — Ambulatory Visit (HOSPITAL_COMMUNITY): Payer: Medicare Other | Admitting: Oncology

## 2015-09-04 ENCOUNTER — Other Ambulatory Visit (HOSPITAL_COMMUNITY): Payer: Medicare Other

## 2015-09-05 ENCOUNTER — Ambulatory Visit (HOSPITAL_COMMUNITY): Payer: Medicare Other | Admitting: Oncology

## 2015-09-05 ENCOUNTER — Other Ambulatory Visit (HOSPITAL_COMMUNITY): Payer: Medicare Other

## 2015-10-08 ENCOUNTER — Encounter (HOSPITAL_COMMUNITY): Payer: Self-pay | Admitting: Oncology

## 2015-10-08 DIAGNOSIS — D751 Secondary polycythemia: Secondary | ICD-10-CM

## 2015-10-08 HISTORY — DX: Secondary polycythemia: D75.1

## 2015-10-08 NOTE — Assessment & Plan Note (Deleted)
Polycythemia, likely secondary to tobacco abuse.  Labs today: CBC diff, CMET, JAK2 [id number] with reflex.  Will confirm secondary polycythemia with MPD labs today.  Smoking cessation education is provided today.

## 2015-10-08 NOTE — Assessment & Plan Note (Deleted)
Protein S Deficiency, anticoagulated with Lovenox daily.  Labs today: CBC diff, CMET  Continue daily Lovenox.    She knows the signs and symptoms of VTE and she understands to report to the ED or call [facility] if she experiences any unilateral extremity edema, severe pain, heat, or erythema, in addition to pleuritic chest pain and sudden onset of SOB.  Return in 6 months for follow-up.

## 2015-10-08 NOTE — Progress Notes (Signed)
NO SHOW

## 2015-10-09 ENCOUNTER — Other Ambulatory Visit (HOSPITAL_COMMUNITY): Payer: Medicare Other

## 2015-10-09 ENCOUNTER — Ambulatory Visit (HOSPITAL_COMMUNITY): Payer: Medicare Other | Admitting: Oncology

## 2016-02-13 IMAGING — US US EXTREM LOW VENOUS*L*
1 series · 13 of 24 positions shown · non-contrast
Comparison: None.

CLINICAL DATA: Left leg pain and swelling for 1 day



[Series 1: us extrem low venous*left* · 0.05mm/px · 13 of 33 slices shown]
[im 1/33]
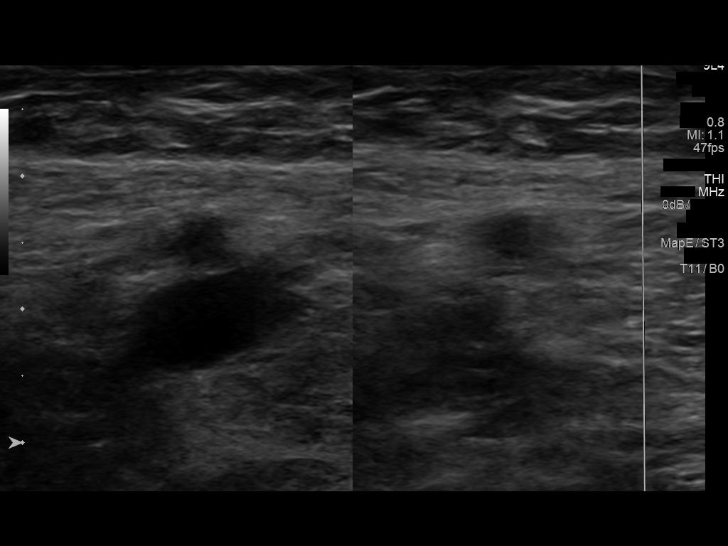
[im 3/33]
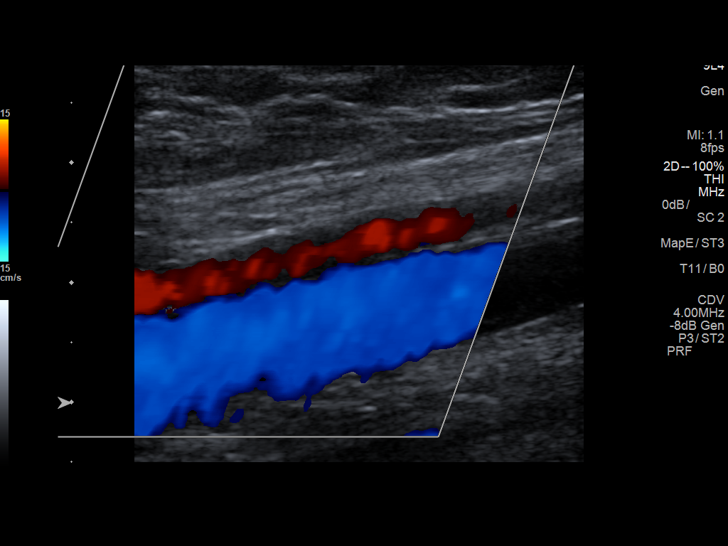
[im 6/33]
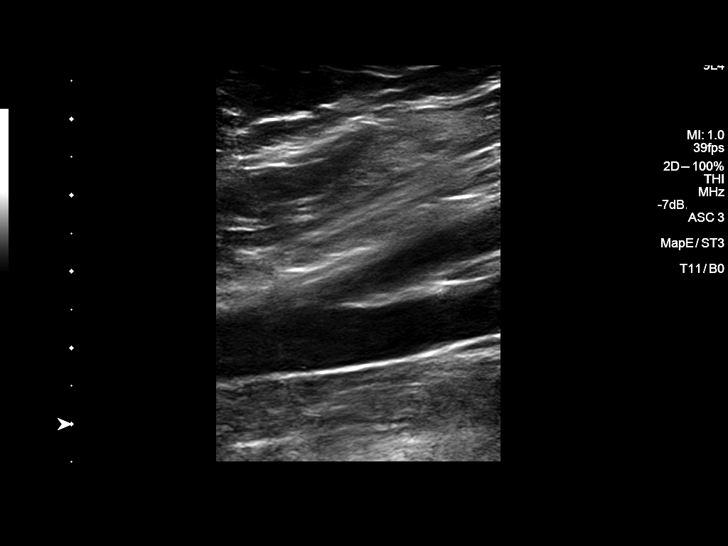
[im 9/33]
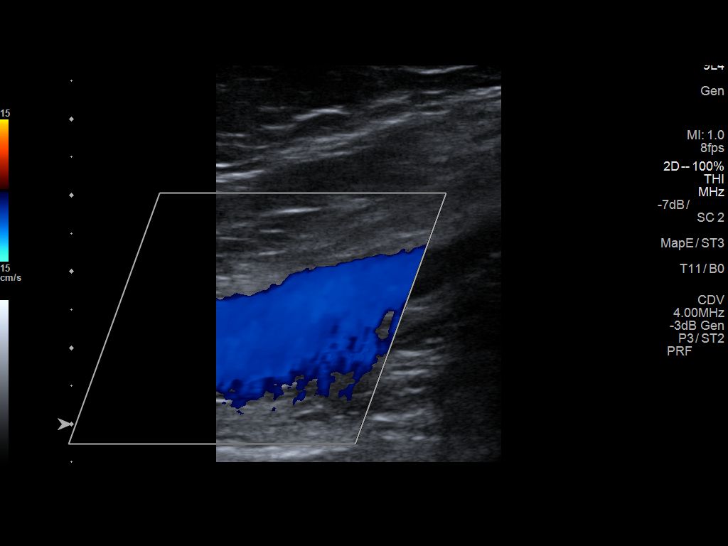
[im 12/33]
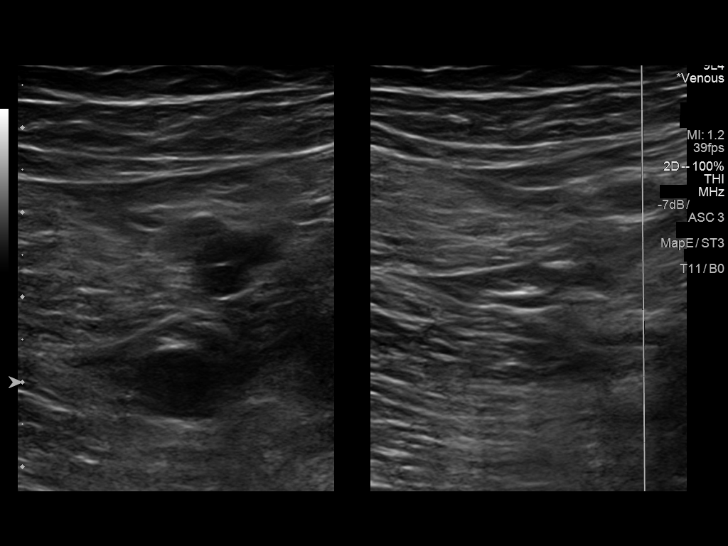
[im 14/33]
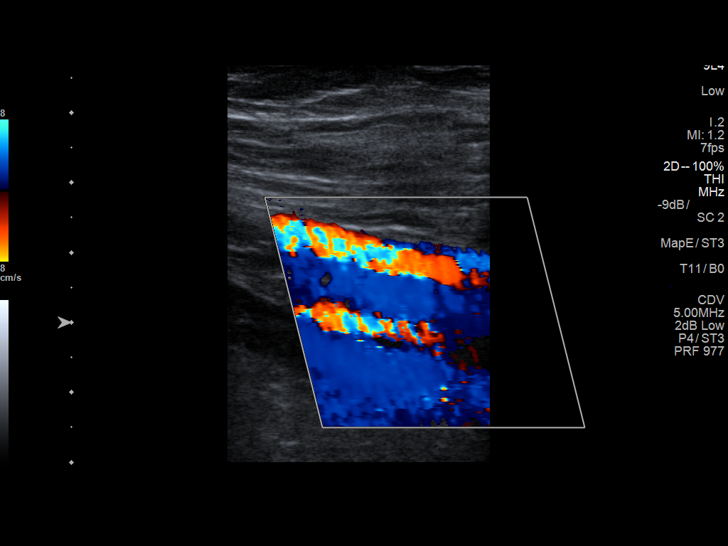
[im 17/33]
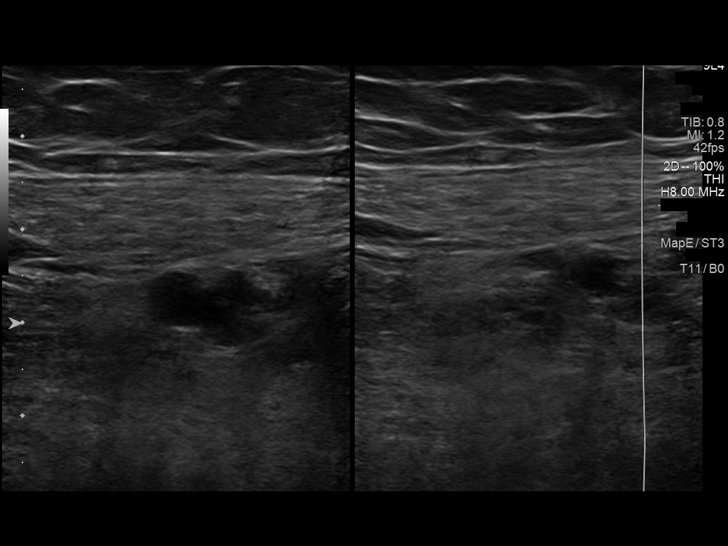
[im 19/33]
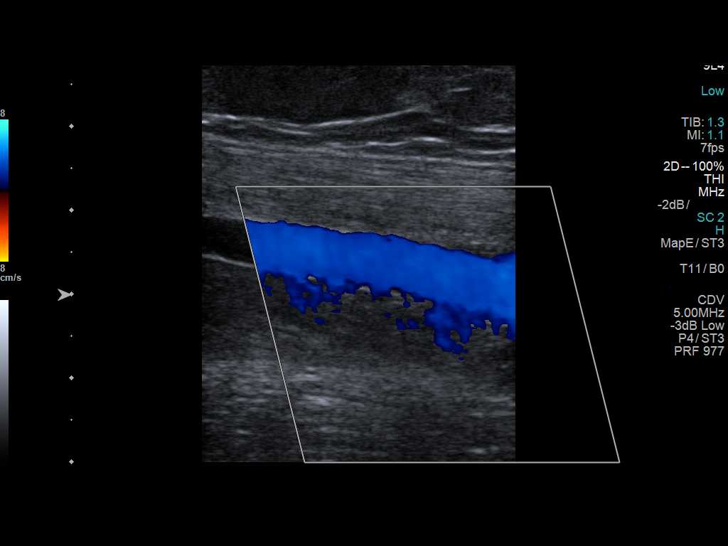
[im 21/33]
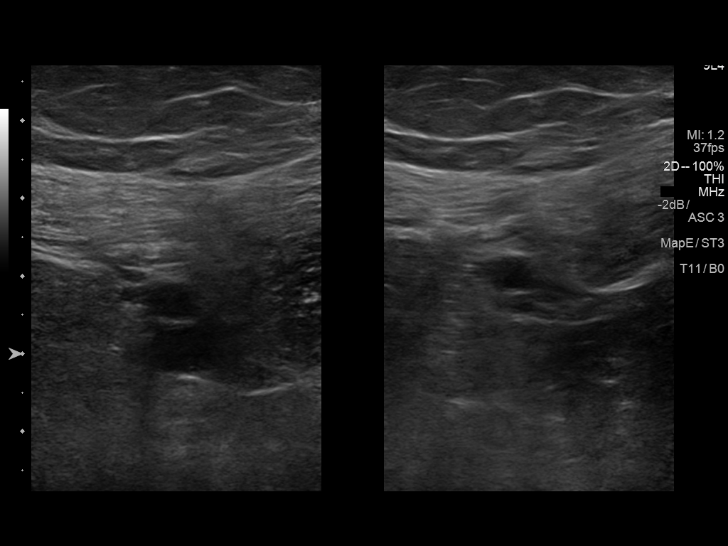
[im 24/33]
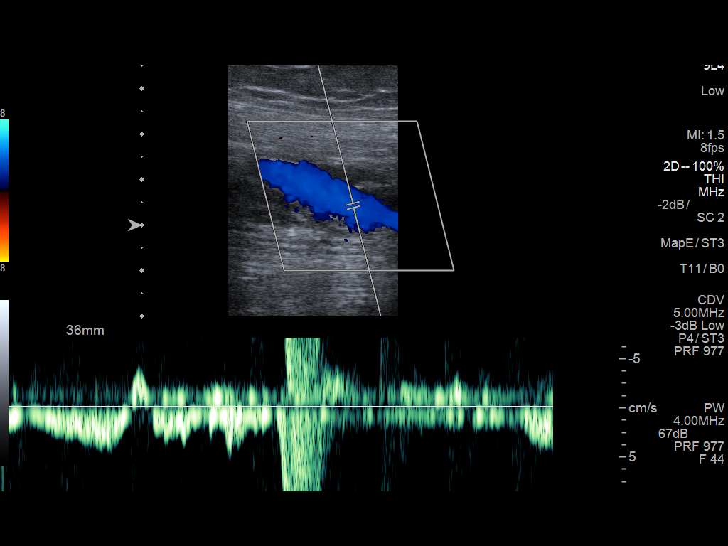
[im 27/33]
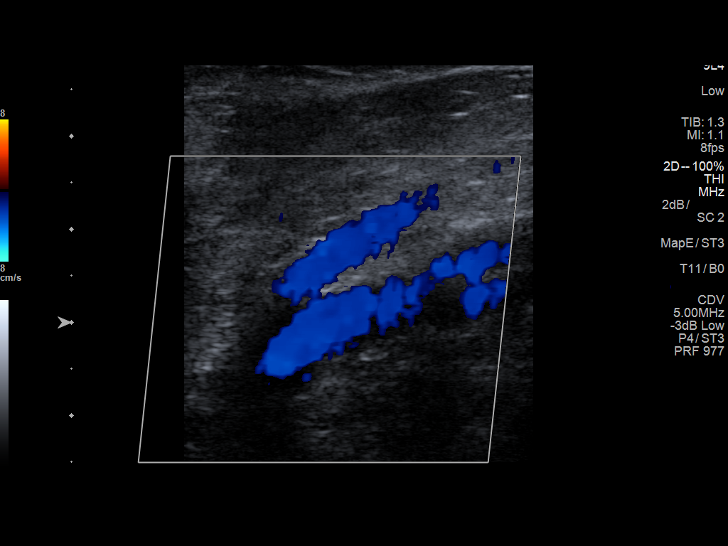
[im 30/33]
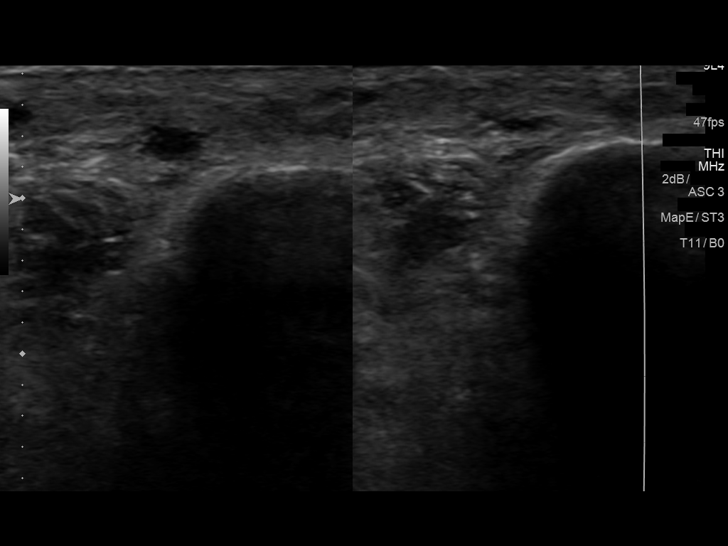
[im 33/33]
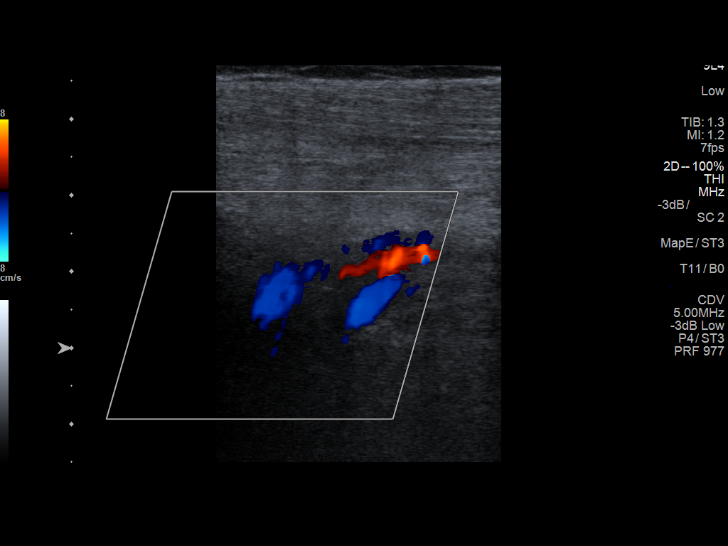

[13 of 24 positions shown; findings below may reference images not displayed]

FINDINGS: Contralateral Common Femoral Vein: Respiratory phasicity is normal
and symmetric with the symptomatic side. No evidence of thrombus.
Normal compressibility.

Common Femoral Vein: No evidence of thrombus. Normal
compressibility, respiratory phasicity and response to augmentation.

Saphenofemoral Junction: No evidence of thrombus. Normal
compressibility and flow on color Doppler imaging.

Profunda Femoral Vein: No evidence of thrombus. Normal
compressibility and flow on color Doppler imaging.

Femoral Vein: No evidence of thrombus. Normal compressibility,
respiratory phasicity and response to augmentation.

Popliteal Vein: No evidence of thrombus. Normal compressibility,
respiratory phasicity and response to augmentation.

Calf Veins: No evidence of thrombus. Normal compressibility and flow
on color Doppler imaging.

Superficial Great Saphenous Vein: No evidence of thrombus. Normal
compressibility and flow on color Doppler imaging.

Venous Reflux:  None.

Other Findings:  None.
IMPRESSION: No evidence of deep venous thrombosis.

## 2016-04-23 IMAGING — CR DG THORACIC SPINE 3V
3 series · 3 of 3 positions shown · non-contrast
Comparison: None.

CLINICAL DATA: Back pain

EXAM:
THORACIC SPINE - 2 VIEW + SWIMMERS

[view not recorded (1 of 3)]
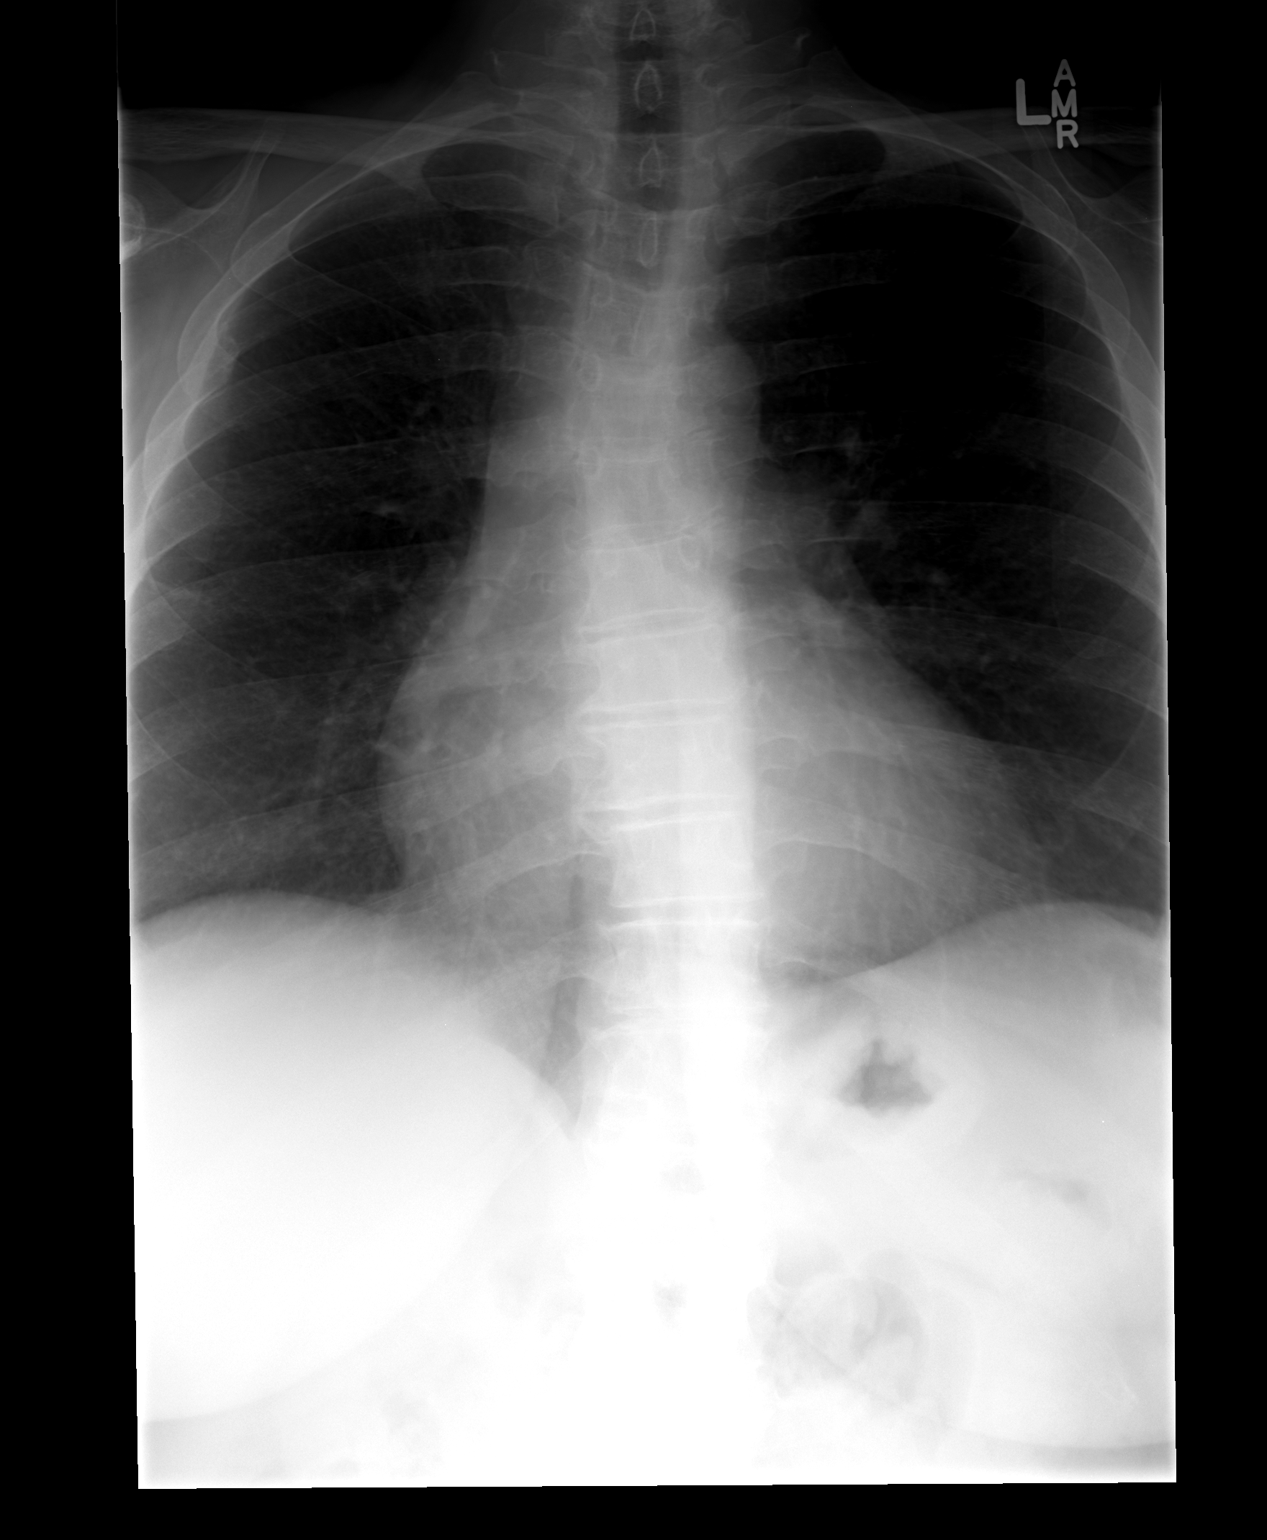

[view not recorded (2 of 3)]
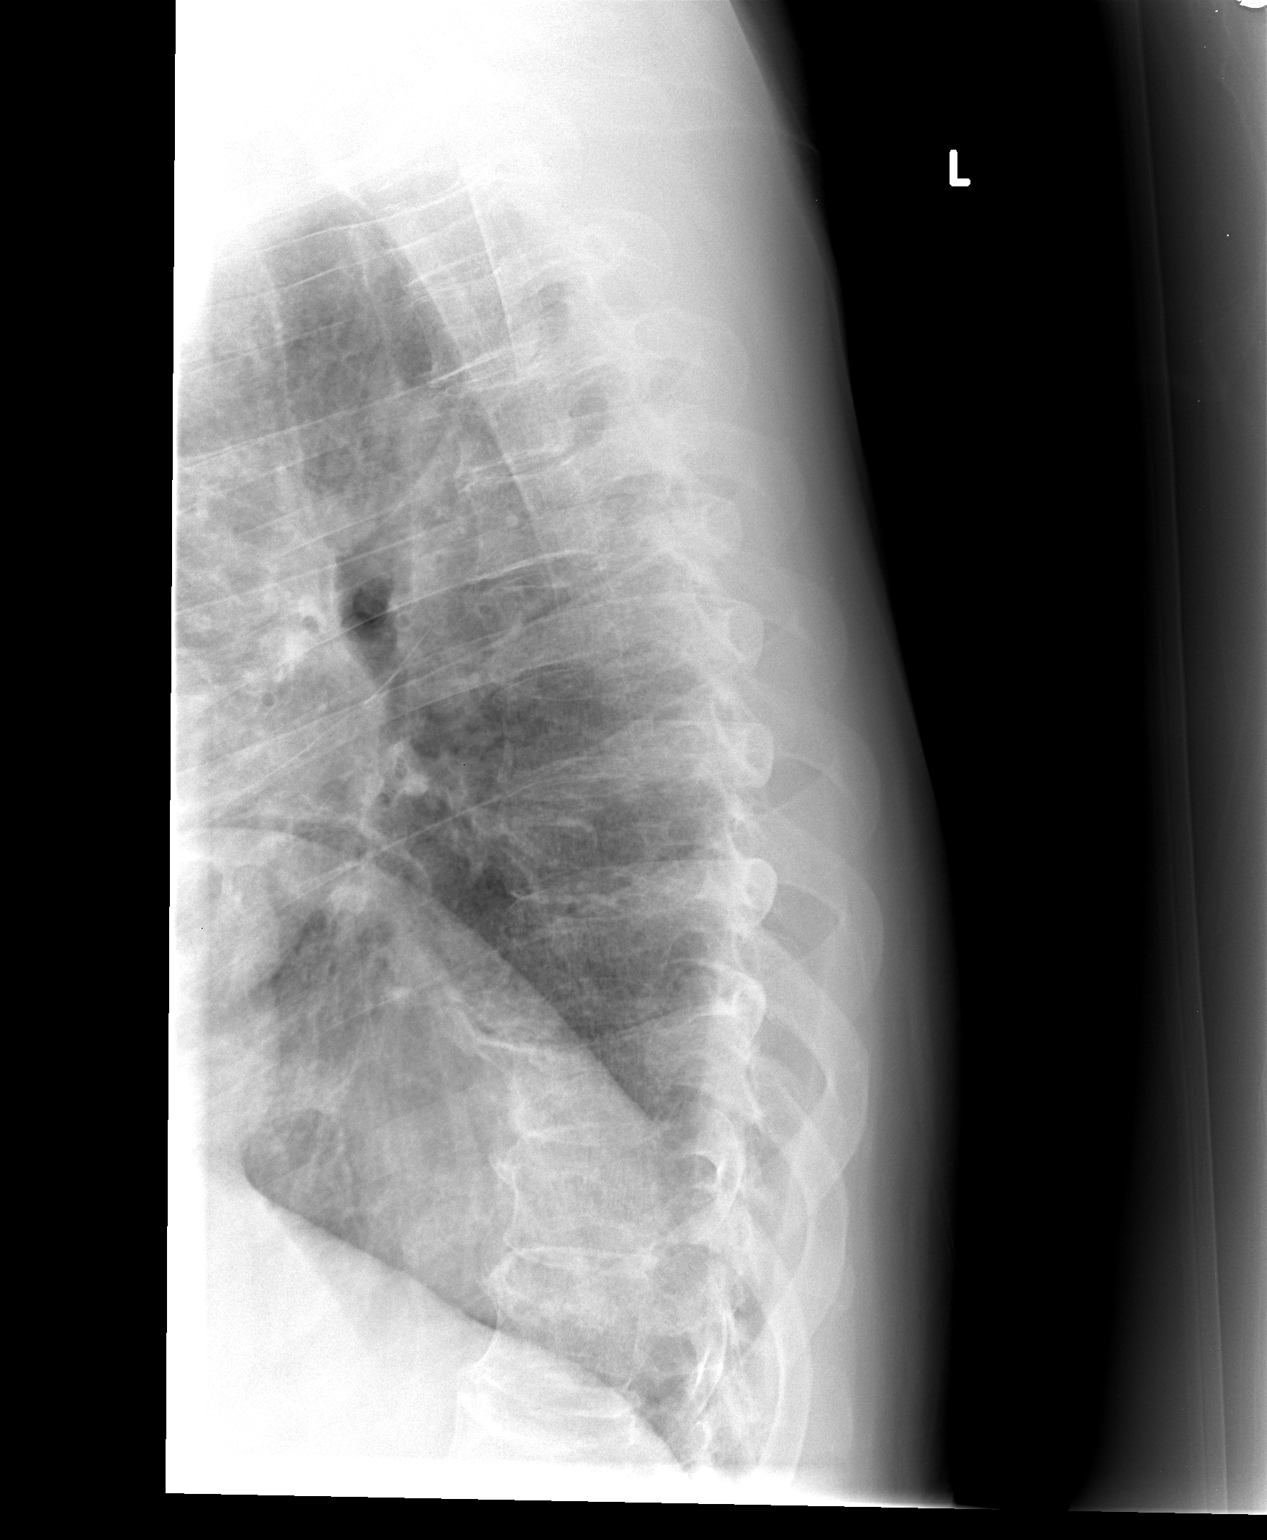

[view not recorded (3 of 3)]
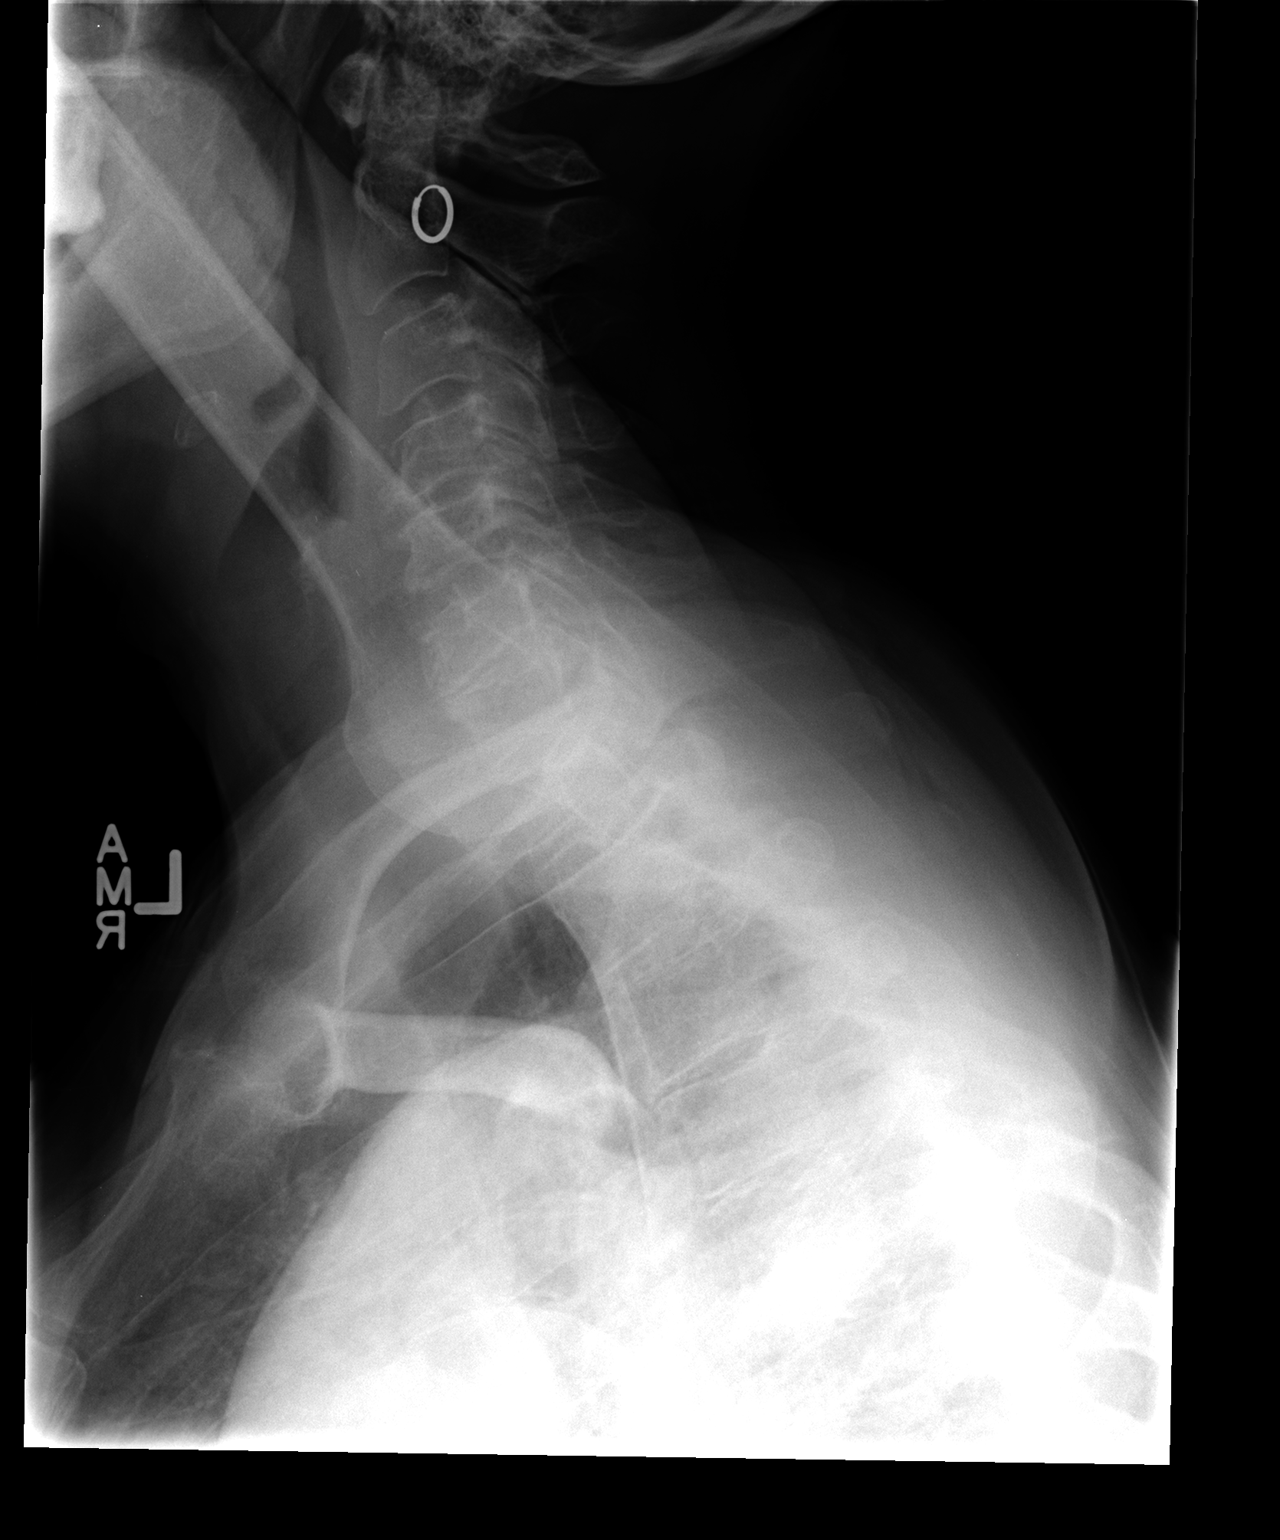

[3 of 3 positions shown; findings below may reference images not displayed]

FINDINGS: Negative for fracture or mass. Bone detail limited due to technique
and osteopenia. Mild disc degeneration and spurring throughout the
thoracic spine. Mild dextroscoliosis.
IMPRESSION: Negative for fracture.

## 2016-05-31 ENCOUNTER — Other Ambulatory Visit (HOSPITAL_COMMUNITY): Payer: Self-pay | Admitting: Oncology

## 2016-05-31 DIAGNOSIS — D6859 Other primary thrombophilia: Secondary | ICD-10-CM

## 2016-05-31 MED ORDER — ENOXAPARIN SODIUM 80 MG/0.8ML ~~LOC~~ SOLN: 80.0000 mg | Freq: Two times a day (BID) | SUBCUTANEOUS | 0 refills | Status: DC

## 2016-06-28 ENCOUNTER — Ambulatory Visit (HOSPITAL_COMMUNITY): Payer: Medicare Other | Admitting: Hematology & Oncology

## 2016-06-28 NOTE — Progress Notes (Incomplete)
[facility] Progress Note  Patient Care Team: Augustine Radar, MD as PCP - General (Nurse Practitioner)  CHIEF COMPLAINTS: Protein S deficiency Rheumatoid Arthritis   HISTORY OF PRESENTING ILLNESS:  Faith Porter 50 y.o. female is here because of a diagnosis of Protein S deficiency.  She is maintained on lovenox daily. She also notes a history of RA. She had previously received all of her hematologic care at [facility] in [place] but since moving to [place] no longer desires to travel there.She has locally established with Augustine Radar as her primary care provider.   She denies any difficulty with her lovenox.  She has been on 80 mg sq bid for many years.  She recalls having phlebotomies in the past for "polycythemia" but she does not know if she has P.Vera or if her high red count is from smoking.   She reports her diagnosis came after she could not breath and was diagnosed with blood clots in both lungs and both LE. She states she had additional blood clots since her original diagnosis and that is why she is now on lovenox.  She states she clotted through warfarin therapy and was told she could not take coumadin.  In regards to her RA, she has been on prednisone, MTX, humara and Enbrel in the past.   She has never had a screening mammogram or colonoscopy.   Faith Porter is here alone today.  ***       MEDICAL HISTORY:  Past Medical History:  Diagnosis Date   Anxiety disorder    AVN (avascular necrosis of bone) (HCC)    right knee   Degenerative disc disease, cervical    Degenerative disc disease, lumbar    Degenerative disc disease, thoracic    Depression    Dextroscoliosis    Diabetes (HCC)    Hypertension    Obesity    Peripheral neuropathy (HCC)    Polycythemia, secondary 10/08/2015   Protein S deficiency (HCC)    Rheumatoid arthritis (HCC)    Spondylosis    Tobacco abuse     SURGICAL HISTORY: Past Surgical  History:  Procedure Laterality Date   BILATERAL CARPAL TUNNEL RELEASE     EYE SURGERY Right    repair fx right orbit   THIRD VENTRICULOSTOMY      SOCIAL HISTORY: Social History   Social History   Marital status: Single    Spouse name: N/A   Number of children: N/A   Years of education: N/A   Occupational History   Not on file.   Social History Main Topics   Smoking status: Current Every Day Smoker    Packs/day: 1.00    Years: 38.00    Types: Cigarettes, Cigars, Pipe   Smokeless tobacco: Never Used     Comment: not interested in classes would like to use patches   Alcohol use No   Drug use: No   Sexual activity: Yes    Birth control/ protection: None   Other Topics Concern   Not on file   Social History Narrative   No narrative on file  Smokes over one ppd and has done so for "many years"  (38 years) She also uses cigars and a pipe. She denies any ETOH use. She is single and has no children.  She previously lived in [place], [place]. She is disabled but previously worked in [employer].  She has a good friend in [place] whom she resides with.  FAMILY HISTORY: Father is alive  at 49 with diabetes and HTN Mother died in her 42's she was on dialysis and had a defibrillator She has one brother with protein S deficiency  ALLERGIES:  is allergic to latex and penicillins.  MEDICATIONS:  Current Outpatient Prescriptions  Medication Sig Dispense Refill   azithromycin (ZITHROMAX Z-PAK) 250 MG tablet Take 2 tablets on day 1 by mouth then one tablet daily thereafter until gone 6 each 0   cloNIDine (CATAPRES) 0.1 MG tablet Take 0.1 mg by mouth 2 (two) times daily.     cyclobenzaprine (FLEXERIL) 10 MG tablet Take 10 mg by mouth 4 (four) times daily.      DULoxetine (CYMBALTA) 60 MG capsule Take 60 mg by mouth 2 (two) times daily.      enoxaparin (LOVENOX) 80 MG/0.8ML injection Inject 0.8 mLs (80 mg total) into the skin every 12 (twelve) hours. 60 Syringe 0    fluconazole (DIFLUCAN) 150 MG tablet Take one tablet by mouth and repeat with second tablet 3 days later 2 tablet 0   HYDROcodone-acetaminophen (NORCO) 10-325 MG per tablet Take 1 tablet by mouth every 4 (four) hours as needed.     ibuprofen (ADVIL,MOTRIN) 200 MG tablet Take 200 mg by mouth every 6 (six) hours as needed.     methylPREDNISolone (MEDROL DOSEPAK) 4 MG TBPK tablet Use as directed 21 tablet 0   metoprolol (LOPRESSOR) 50 MG tablet Take 50 mg by mouth at bedtime.     oxycodone (OXY-IR) 5 MG capsule Take 10 mg by mouth every 4 (four) hours as needed.     pregabalin (LYRICA) 75 MG capsule Take 200 mg by mouth 3 (three) times daily.      No current facility-administered medications for this visit.     Review of Systems  Constitutional: Negative.        Ambulates with cane.  HENT: Negative.   Eyes: Negative.   Respiratory: Negative.   Cardiovascular: Negative.   Gastrointestinal: Negative.   Genitourinary: Negative.   Musculoskeletal: Negative.   Skin: Negative.   Neurological: Negative.   Endo/Heme/Allergies: Negative.   Psychiatric/Behavioral: Negative.   All other systems reviewed and are negative.   PHYSICAL EXAMINATION: ECOG PERFORMANCE STATUS: 0 - Asymptomatic  There were no vitals filed for this visit. There were no vitals filed for this visit. ***  Physical Exam  Constitutional: She is oriented to person, place, and time and well-developed, well-nourished, and in no distress.  HENT:  Head: Normocephalic and atraumatic.  Nose: Nose normal.  Mouth/Throat: Oropharynx is clear and moist. No oropharyngeal exudate.  Eyes: Conjunctivae and EOM are normal. Pupils are equal, round, and reactive to light. Right eye exhibits no discharge. Left eye exhibits no discharge. No scleral icterus.  Neck: Normal range of motion. Neck supple. No tracheal deviation present. No thyromegaly present.  Cardiovascular: Normal rate, regular rhythm and normal heart sounds.  Exam  reveals no gallop and no friction rub.   No murmur heard. Pulmonary/Chest: Effort normal. She has wheezes. She has no rales.  Decreased breath sounds throughout. Expiratory wheezing. Occasional rhonchi heard throughout.  Abdominal: Soft. Bowel sounds are normal. She exhibits no distension and no mass. There is no tenderness. There is no rebound and no guarding.  Musculoskeletal: Normal range of motion. She exhibits no edema.  LLE > RLE. No erythema or warmth  Lymphadenopathy:    She has no cervical adenopathy.  Neurological: She is alert and oriented to person, place, and time. She has normal reflexes. No cranial nerve deficit. Gait  normal. Coordination normal.  Skin: Skin is warm and dry. No rash noted.  Psychiatric: Mood, memory, affect and judgment normal.  Nursing note and vitals reviewed.    LABORATORY DATA:  I have reviewed the data as listed Lab Results  Component Value Date   WBC 14.9 (H) 05/05/2015   HGB 17.9 (H) 05/05/2015   HCT 50.4 (H) 05/05/2015   MCV 99.8 05/05/2015   PLT 289 05/05/2015     Chemistry      Component Value Date/Time   NA 139 05/05/2015 1331   K 3.5 05/05/2015 1331   CL 106 05/05/2015 1331   CO2 26 05/05/2015 1331   BUN 8 05/05/2015 1331   CREATININE 0.50 05/05/2015 1331      Component Value Date/Time   CALCIUM 8.9 05/05/2015 1331   ALKPHOS 118 05/05/2015 1331   AST 17 05/05/2015 1331   ALT 12 (L) 05/05/2015 1331   BILITOT 0.4 05/05/2015 1331       ASSESSMENT & PLAN:  Protein S deficiency 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 Secondary to above  Protein S deficiency  We will continue her on Lovenox as prescribed. I have advised her if she has any difficulties with her Lovenox prescription prior to follow-up to let us know. We will check a LLE U/S today. She is encouraged to be strictly compliant with her lovenox. I have refilled her lovenox to [facility]  in [place].  Polycythemia She missed several appointments for further follow-up of her polycythemia. Most likely it is secondary to her chronic tobacco use. However, given intermittent leukocytosis I would like further evaluation. She does not wish to have additional labs drawn today and we will plan on an MPD evaluation at her next f/u.    Tobacco Abuse  I addressed the importance of smoking cessation with the patient in detail.  We discussed the health benefits of cessation.  We discussed the health detriments of ongoing tobacco use including but not limited to COPD, heart disease and malignancy. We reviewed the multiple options for cessation and I offered to refer her to smoking cessation classes. We discussed other alternatives to quit such as chantix, wellbutrin. We will continue to address this moving forward.   Screening  She missed her screening mammogram. She does not want this re-ordered. We can address this again at f/u.  Bronchitis/COPD exacerbation  I have ordered a Chest X ray for today. I will prescribe an antibiotic for Faith Porter pending the X ray results.  //***     She will return for a follow up in ***  No orders of the defined types were placed in this encounter.   All questions were answered. The patient knows to call the clinic with any problems, questions or concerns. This note was electronically signed.    This document serves as a record of services personally performed by Loma Messing, MD. It was created on her behalf by [place] Casey, a trained medical scribe. The creation of this record is based on the scribe's personal observations and the provider's statements to them. This document has been checked and approved by the attending provider.  I have reviewed the above documentation for accuracy and completeness, and I agree with the above.  Novella Olive. Penland, MD  06/28/2016 8:27 AM

## 2016-08-02 ENCOUNTER — Encounter (HOSPITAL_COMMUNITY): Payer: Medicare Other | Admitting: Hematology & Oncology

## 2016-08-05 ENCOUNTER — Telehealth (HOSPITAL_COMMUNITY): Payer: Self-pay | Admitting: *Deleted

## 2016-08-05 ENCOUNTER — Other Ambulatory Visit (HOSPITAL_COMMUNITY): Payer: Self-pay | Admitting: *Deleted

## 2016-08-05 DIAGNOSIS — D6859 Other primary thrombophilia: Secondary | ICD-10-CM

## 2016-08-05 MED ORDER — ENOXAPARIN SODIUM 80 MG/0.8ML ~~LOC~~ SOLN: 80.0000 mg | Freq: Two times a day (BID) | SUBCUTANEOUS | 0 refills | Status: DC

## 2016-08-06 ENCOUNTER — Other Ambulatory Visit (HOSPITAL_COMMUNITY): Payer: Self-pay

## 2016-08-06 ENCOUNTER — Telehealth (HOSPITAL_COMMUNITY): Payer: Self-pay | Admitting: *Deleted

## 2016-08-06 ENCOUNTER — Other Ambulatory Visit (HOSPITAL_COMMUNITY): Payer: Self-pay | Admitting: *Deleted

## 2016-08-06 DIAGNOSIS — D6859 Other primary thrombophilia: Secondary | ICD-10-CM

## 2016-08-06 MED ORDER — ENOXAPARIN SODIUM 80 MG/0.8ML ~~LOC~~ SOLN: 80.0000 mg | Freq: Two times a day (BID) | SUBCUTANEOUS | 0 refills | Status: DC

## 2016-08-06 NOTE — Telephone Encounter (Signed)
Prescription was e-scribed yesterday but did not go. Faxed prescription today to [facility].

## 2016-08-10 ENCOUNTER — Ambulatory Visit (HOSPITAL_COMMUNITY): Payer: Medicare Other

## 2016-09-08 ENCOUNTER — Ambulatory Visit (HOSPITAL_COMMUNITY): Payer: Medicare Other

## 2016-09-27 ENCOUNTER — Other Ambulatory Visit (HOSPITAL_COMMUNITY): Payer: Self-pay | Admitting: Hematology & Oncology

## 2016-09-27 DIAGNOSIS — D6859 Other primary thrombophilia: Secondary | ICD-10-CM

## 2016-10-04 ENCOUNTER — Ambulatory Visit (HOSPITAL_COMMUNITY): Payer: Medicare Other

## 2016-12-08 ENCOUNTER — Other Ambulatory Visit (HOSPITAL_COMMUNITY): Payer: Self-pay | Admitting: Oncology

## 2016-12-08 DIAGNOSIS — D6859 Other primary thrombophilia: Secondary | ICD-10-CM

## 2016-12-24 ENCOUNTER — Ambulatory Visit (HOSPITAL_COMMUNITY): Payer: Medicare Other

## 2017-01-14 ENCOUNTER — Encounter (HOSPITAL_COMMUNITY): Payer: Medicare Other | Attending: Oncology

## 2017-06-22 IMAGING — DX DG CHEST 2V
2 series · 2 of 2 positions shown · non-contrast
Comparison: 03/06/2014 and thoracic spine radiographs dated
03/06/2014.

CLINICAL DATA: Productive cough for the past 3 weeks. Initially,
the patient had fever and pharyngitis. Chest tightness and shortness
of breath. Smoker.

EXAM:
CHEST  2 VIEW

[chest pa]
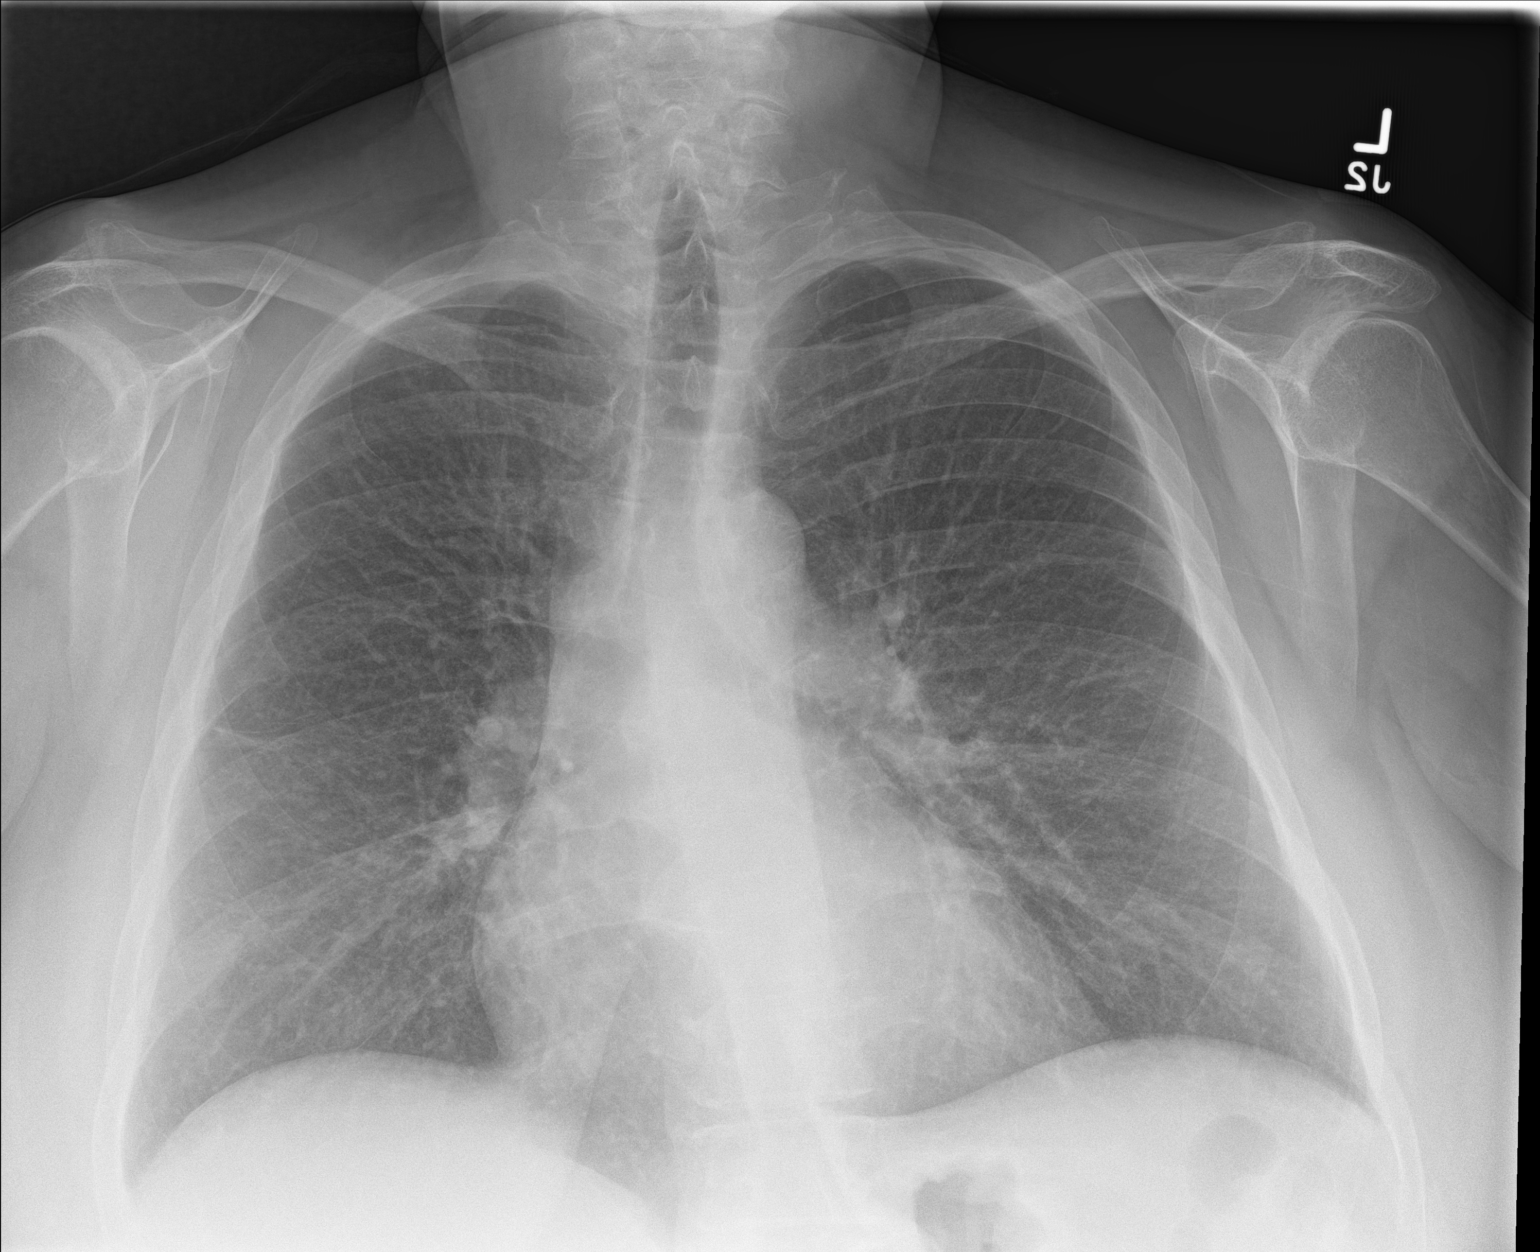

[chest lat]
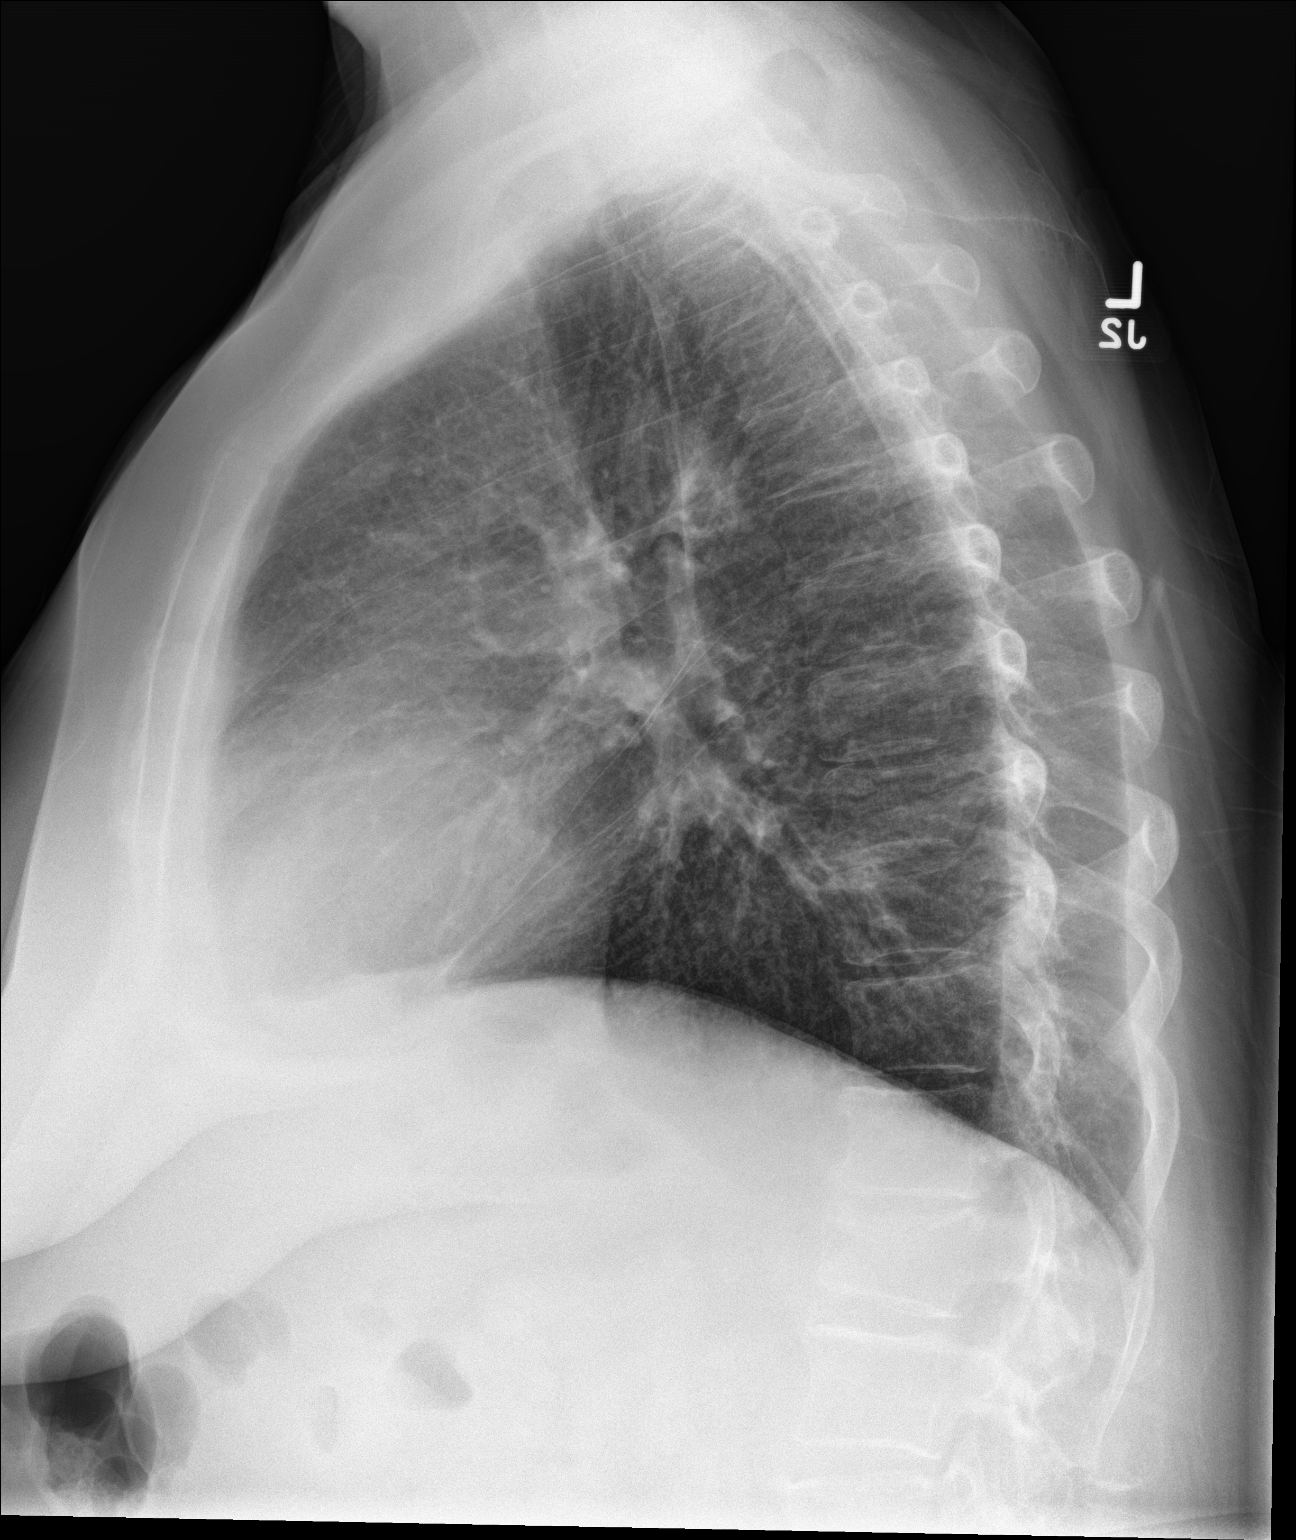

[2 of 2 positions shown; findings below may reference images not displayed]

FINDINGS: Normal sized heart. Stable linear scar in the right mid lung zone.
Mild diffuse peribronchial thickening without significant change.
Mild thoracic spine degenerative changes and mild scoliosis.
Approximately 25% thoracolumbar vertebral compression deformity, not
previously included. This is involving the superior endplate without
bony retropulsion or visible acute fracture lines.
IMPRESSION: 1. Stable mild chronic bronchitic changes.
2. Approximately 25% old thoracolumbar vertebral superior
compression fracture.

## 2021-09-09 DEATH — deceased
